# Patient Record
Sex: Female | Born: 1980 | Race: White | Hispanic: No | Marital: Married | Smoking: Former smoker
Health system: Southern US, Community
[De-identification: ages and names within clinical notes are randomized; demographics above are authoritative.]

## PROBLEM LIST (undated history)

## (undated) DIAGNOSIS — I10 Essential (primary) hypertension: Secondary | ICD-10-CM

## (undated) HISTORY — DX: Essential (primary) hypertension: I10

---
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
## 2006-12-03 ENCOUNTER — Inpatient Hospital Stay (HOSPITAL_COMMUNITY): Admission: AD | Admit: 2006-12-03 | Discharge: 2006-12-06 | Payer: Self-pay | Admitting: Obstetrics and Gynecology

## 2006-12-03 ENCOUNTER — Encounter (INDEPENDENT_AMBULATORY_CARE_PROVIDER_SITE_OTHER): Payer: Self-pay | Admitting: Obstetrics & Gynecology

## 2010-07-16 NOTE — Op Note (Signed)
NAMEREKISHA, [name]             ACCOUNT NO.:  [id number]   MEDICAL RECORD NO.:  [id number]          PATIENT TYPE:  INP   LOCATION:  9166                          FACILITY:  [facility]   PHYSICIAN:  Gerrit Friends. Aldona Bar, M.D.   DATE OF BIRTH:  May 30, 1980   DATE OF PROCEDURE:  12/03/2006  DATE OF DISCHARGE:                               OPERATIVE REPORT   PREOPERATIVE DIAGNOSES:  1. Term pregnancy.  2. Labor.  3. Preeclampsia.  4. Arrest of second stage of labor, arrest of descent.   POSTOPERATIVE DIAGNOSES:  1. Term pregnancy.  2. Labor.  3. Preeclampsia.  4. Arrest of second stage of labor, arrest of descent.  5. Delivery of a 6 pound 6 ounce female infant, Apgars 9 and 9.   PROCEDURE:  Primary low transverse cesarean section.   SURGEON:  Gerrit Friends. Aldona Bar, M.D.   ANESTHESIA:  Epidural.   HISTORY:  This 30 year old primigravida was admitted on the morning of  October 2 in early labor and noted to be pre-eclamptic and was begun on  magnesium sulfate IV.  An epidural was eventually placed and her labor  progressed, although the station at the time of my initial examination  was about -2.  At that time she was about 4 cm dilated.   The patient progressed and became fully dilated at about 1:45 p.m. and  began pushing.  At that time the vertex was only had about -1 station.  With approximately an hour and 45 minutes of pushing, the vertex really  did not descend beyond 0 station and only to about +1 with pushing, but  it was mostly caput.  A decision was made because of the arrest disorder  of descent to proceed with a primary low transverse cesarean section.   The patient was taken to the operating room, where after the  satisfactory augmentation of her epidural she was prepped and draped in  the usual fashion.  The Foley catheter came with the patient from the  labor and delivery area.   Once the patient was adequately draped and good anesthetic levels were  documented, the procedure  was begun.  A Pfannenstiel incision was made  and with minimal difficulty dissected down sharply to and through the  fascia in a low transverse fashion with hemostasis created at each  layer.  The subfascial space was created inferiorly and superiorly,  muscles separated in the midline and the peritoneum identified and  entered appropriately with care taken to avoid the bladder inferiorly  and the bowel superiorly.  At this time the vesicouterine peritoneum was  identified, incised in a low transverse fashion, pushed off the lower  uterine segment with ease, and then sharp incision into the lower  segment with Metzenbaum scissors was carried out in a low transverse  fashion and extended laterally with the fingers.  The head, as expected  was wedged in the pelvis but with minimal difficulty was elevated and  the baby was delivered.  The baby cried spontaneously at once, was female,  subsequent weight was found to be 6 pounds 6 ounces, Apgars were noted  to be 9 and 9, and the baby was taken to the newborn nursery in good  condition.   Placenta was delivered intact and passed off  and cord bloods will be  collected by the cord blood donation team.  The patient is a cord blood  donor.   At this time the uterus was exteriorized and rendered free of any  remaining products of conception.  Good uterine contractility was  afforded with slowly-given intravenous Pitocin and manual stimulation.  The incision extended inferiorly somewhat but was easy to identify and  the uterine incision was ultimately closed using two layers, the first  being #1 Vicryl in a running locking fashion and the second being #1  Vicryl in a running imbricating fashion.  An additional figure-of-eight  was placed for additional hemostasis with good results.  At this time  the incision was noted to be dry.  Tubes and ovaries normal, uterus well-  contracted and after the abdomen was lavaged of all free blood and clot  in  all counts were noted to be correct and no foreign bodies were noted  to be remaining in the abdominal cavity, the uterus was replaced and  closure of the abdomen was begun in layers.  The abdominal peritoneum  was closed with 0 Vicryl in a running fashion and the muscle secured  with same.  Assured of good subfascial hemostasis, the fascia was then  reapproximated with 0 Vicryl from angle to midline bilaterally.  Subcutaneous tissue was now hemostatic and staples were then used to  close the skin.  A sterile pressure dressing was applied.  At this time  the patient was transported to the recovery room in satisfactory  condition, having tolerated the procedure well.  Estimated blood loss  500 mL.  All counts correct x2.   CONCLUSION:  This patient essentially had an arrest disorder of the  second stage of labor and was delivered by primary low transverse  cesarean section with delivery of 6 pound 6 ounce female infant with  Apgars of 9 and 9.  Complicating her labor was preeclampsia, which was  controlled/treated with magnesium sulfate intravenously per protocol.  At the conclusion of the procedure both mother and baby were doing well  in their respective recovery areas.      Gerrit Friends. Aldona Bar, M.D.  Electronically Signed     RMW/MEDQ  D:  12/03/2006  T:  12/04/2006  Job:  [id number]

## 2010-07-19 NOTE — Discharge Summary (Signed)
NAMECRISTYN, [name]             ACCOUNT NO.:  [id number]   MEDICAL RECORD NO.:  [id number]          PATIENT TYPE:  INP   LOCATION:  9304                          FACILITY:  [facility]   PHYSICIAN:  Malva Limes, M.D.    DATE OF BIRTH:  June 09, 1980   DATE OF ADMISSION:  12/03/2006  DATE OF DISCHARGE:  12/06/2006                               DISCHARGE SUMMARY   FINAL DIAGNOSES:  Intrauterine pregnancy at term active labor,  preeclampsia arrest at the second stage of labor and arrest of descent.   PROCEDURES:  Primary low transverse cesarean section.  Surgeon Dr.  Annamaria Helling.  Complications:  None.   This 30 year old G1, P0 was admitted on the morning of October 2 in  early labor.  The patient was noted to be  preeclamptic at this time and was started on mag sulfate for  prophylaxis.  The patient's antepartum course up to this point had been  complicated by some late prenatal care, but she did not start receiving  until 21 weeks.  The patient did conceive while using Depo-Provera.  The  patient also had a positive group B strep culture obtained at 35 weeks.  Otherwise, uncomplicated prenatal care.  During patient's hospital  course she like I said was admitted in early labor.  She had an epidural  placed for pain control by the time she was examined by Dr. Aldona Bar.  On  the morning of the second she was about 4 cm dilated.  She became  completely dilated about 1:45 and began pushing.  The vertex at this  point was only in a -1 station after about an hour and 45 minutes of  pushing.  The vertex did not descend beyond a 0 station.  At this point  because of arrest of descent in the second stage of labor, a decision  was made to proceed with a cesarean section.  The patient was taken to  the operating room on December 03, 2006 by Dr. Annamaria Helling where a primary  low transverse cesarean section was performed with the delivery of a 6  pound 6 ounce female infant with Apgars of 9 and 9.  The delivery  went  without complications.  The patient's postoperative course was  complicated by her continued preeclampsia.  She was kept on mag sulfate.  She continued to have elevated blood pressures.  By postoperative day  #2 her preeclampsia was resolving.  She had good urine output.  The mag  sulfate was Skinner to be stopped, and she was transferred to the regular  mother baby unit.  The patient was seen by social services before  discharge.  By postoperative day #3 the patient was stable.  Blood  pressures were still slightly elevated.  She was to check these three  times a day and to call if they are greater than 150/95.  She was sent  home by Dr. Malva Limes on a regular diet, told to decrease her  activities, to continue her vitamins.  Was given Percocet 1-2 every 4-6  hours as needed for her pain. Told she could use over-the-counter  ibuprofen up to 600 mg every six hours as needed for pain.  Was to  continue to check her blood pressures three times daily and to call if  greater than 150/95 and was to follow up in our office one week for her  blood pressure check or sooner as needed.  Instructions and precautions  were reviewed with the patient.   LABORATORY DATA:  Labs on discharge:  She had a hemoglobin of 11.9,  white blood cell count 20.8, platelets 193,000.  The patient did not  have elevated liver function tests and normal preeclamptic panel.      [name], P.A.-C.    ______________________________  Malva Limes, M.D.    MB/MEDQ  D:  01/04/2007  T:  01/05/2007  Job:  [id number]

## 2010-11-11 ENCOUNTER — Other Ambulatory Visit: Payer: Self-pay | Admitting: Obstetrics and Gynecology

## 2010-12-12 LAB — URINALYSIS, ROUTINE W REFLEX MICROSCOPIC
Glucose, UA: NEGATIVE
Leukocytes, UA: NEGATIVE
Nitrite: NEGATIVE
Protein, ur: >300 — AB
Urobilinogen, UA: 0.2

## 2010-12-12 LAB — CBC
HCT: 42.6
Hemoglobin: 11.9 — ABNORMAL LOW
MCHC: 34.7
MCHC: 34.8
MCV: 91.3
MCV: 92.3
Platelets: 247
RBC: 3.71 — ABNORMAL LOW
RDW: 12.5

## 2010-12-12 LAB — URINE MICROSCOPIC-ADD ON

## 2010-12-12 LAB — DIFFERENTIAL
Basophils Relative: 0
Eosinophils Absolute: 0
Monocytes Absolute: 1.2 — ABNORMAL HIGH
Monocytes Relative: 6
Neutro Abs: 18.1 — ABNORMAL HIGH

## 2010-12-12 LAB — MAGNESIUM: Magnesium: 6.5

## 2010-12-12 LAB — COMPREHENSIVE METABOLIC PANEL
Albumin: 2.4 — ABNORMAL LOW
Alkaline Phosphatase: 106
BUN: 14
BUN: 15
CO2: 23
CO2: 25
Calcium: 8.9
Creatinine, Ser: 0.74
GFR calc non Af Amer: >60
GFR calc non Af Amer: >60
Glucose, Bld: 106 — ABNORMAL HIGH
Glucose, Bld: 95
Potassium: 4.4
Sodium: 135
Total Bilirubin: 0.5
Total Bilirubin: 0.5
Total Protein: 5.1 — ABNORMAL LOW
Total Protein: 6.3

## 2011-10-20 ENCOUNTER — Emergency Department (HOSPITAL_COMMUNITY)
Admission: EM | Admit: 2011-10-20 | Discharge: 2011-10-20 | Disposition: A | Discharge status: Home / Self Care | Payer: BC Managed Care – PPO | Attending: Emergency Medicine | Admitting: Emergency Medicine

## 2011-10-20 ENCOUNTER — Encounter (HOSPITAL_COMMUNITY): Payer: Self-pay | Admitting: *Deleted

## 2011-10-20 ENCOUNTER — Emergency Department (HOSPITAL_COMMUNITY): Payer: BC Managed Care – PPO

## 2011-10-20 DIAGNOSIS — R112 Nausea with vomiting, unspecified: Secondary | ICD-10-CM | POA: Insufficient documentation

## 2011-10-20 DIAGNOSIS — R109 Unspecified abdominal pain: Secondary | ICD-10-CM

## 2011-10-20 DIAGNOSIS — M545 Low back pain, unspecified: Secondary | ICD-10-CM | POA: Insufficient documentation

## 2011-10-20 LAB — CBC WITH DIFFERENTIAL/PLATELET
Basophils Absolute: 0 K/uL (ref 0.0–0.1)
Basophils Relative: 0 % (ref 0–1)
Eosinophils Absolute: 0.1 10*3/uL (ref 0.0–0.7)
Eosinophils Relative: 0 % (ref 0–5)
HCT: 42.8 % (ref 36.0–46.0)
Hemoglobin: 14.8 g/dL (ref 12.0–15.0)
Lymphocytes Relative: 17 % (ref 12–46)
Lymphs Abs: 2 K/uL (ref 0.7–4.0)
MCH: 30.6 pg (ref 26.0–34.0)
MCHC: 34.6 g/dL (ref 30.0–36.0)
MCV: 88.6 fL (ref 78.0–100.0)
Monocytes Absolute: 0.6 K/uL (ref 0.1–1.0)
Monocytes Relative: 5 % (ref 3–12)
Neutro Abs: 9.1 K/uL — ABNORMAL HIGH (ref 1.7–7.7)
Neutrophils Relative %: 77 % (ref 43–77)
Platelets: 226 10*3/uL (ref 150–400)
RBC: 4.83 MIL/uL (ref 3.87–5.11)
RDW: 11.9 % (ref 11.5–15.5)
WBC: 11.8 K/uL — ABNORMAL HIGH (ref 4.0–10.5)

## 2011-10-20 LAB — URINALYSIS, ROUTINE W REFLEX MICROSCOPIC
Bilirubin Urine: NEGATIVE
Glucose, UA: NEGATIVE mg/dL
Hgb urine dipstick: NEGATIVE
Ketones, ur: NEGATIVE mg/dL
Leukocytes, UA: NEGATIVE
Nitrite: NEGATIVE
Protein, ur: NEGATIVE mg/dL
Specific Gravity, Urine: 1.012 (ref 1.005–1.030)
Urobilinogen, UA: 0.2 mg/dL (ref 0.0–1.0)
pH: 7.5 (ref 5.0–8.0)

## 2011-10-20 LAB — POCT I-STAT, CHEM 8
BUN: 12 mg/dL (ref 6–23)
Calcium, Ion: 1.12 mmol/L (ref 1.12–1.23)
Chloride: 105 meq/L (ref 96–112)
Creatinine, Ser: 0.8 mg/dL (ref 0.50–1.10)
Glucose, Bld: 133 mg/dL — ABNORMAL HIGH (ref 70–99)
HCT: 46 % (ref 36.0–46.0)
Hemoglobin: 15.6 g/dL — ABNORMAL HIGH (ref 12.0–15.0)
Potassium: 3.2 mEq/L — ABNORMAL LOW (ref 3.5–5.1)
Sodium: 140 mEq/L (ref 135–145)
TCO2: 21 mmol/L (ref 0–100)

## 2011-10-20 MED ORDER — ONDANSETRON HCL 4 MG/2ML IJ SOLN
INTRAMUSCULAR | Status: AC
  Administered 2011-10-20: 4 mg via INTRAVENOUS
  Filled 2011-10-20: qty 2

## 2011-10-20 MED ORDER — MORPHINE SULFATE 4 MG/ML IJ SOLN
INTRAMUSCULAR | Status: AC
  Administered 2011-10-20: 4 mg via INTRAVENOUS
  Filled 2011-10-20: qty 1

## 2011-10-20 MED ORDER — KETOROLAC TROMETHAMINE 30 MG/ML IJ SOLN
30.0000 mg | Freq: Once | INTRAMUSCULAR | Status: AC
  Administered 2011-10-20: 30 mg via INTRAVENOUS
  Filled 2011-10-20: qty 1

## 2011-10-20 MED ORDER — HYDROMORPHONE HCL PF 1 MG/ML IJ SOLN
1.0000 mg | Freq: Once | INTRAMUSCULAR | Status: AC
  Administered 2011-10-20: 1 mg via INTRAVENOUS
  Filled 2011-10-20: qty 1

## 2011-10-20 MED ORDER — ONDANSETRON 8 MG PO TBDP: 8.0000 mg | ORAL_TABLET | Freq: Two times a day (BID) | ORAL | Status: AC | PRN

## 2011-10-20 MED ORDER — ACETAMINOPHEN-CODEINE #3 300-30 MG PO TABS: 1.0000 | ORAL_TABLET | Freq: Four times a day (QID) | ORAL | Status: AC | PRN

## 2011-10-20 NOTE — ED Notes (Signed)
Pt is here with L lower back that started over one hour ago and reports nausea and vomiting.  LMP- one month ago

## 2011-10-20 NOTE — ED Provider Notes (Signed)
History     CSN: [id number]  Arrival date & time 10/20/11  1420   First MD Initiated Contact with Patient 10/20/11 1517      Chief Complaint  Patient presents with  . Back Pain    (Consider location/radiation/quality/duration/timing/severity/associated sxs/prior treatment) HPI Comments: Level 5 caveat due to 2 severe pain and urgent need for intervention. Patient reports sudden onset of left mid and low back pain that radiates to the left abdomen about 2 hours ago. She reports that she has the urge to either urinate or defecate she is unsure. The pain is quite severe. She did have some nausea and vomiting as well. She reports she was previously in her usual state of health. She has not taken any medications for this. Prior to my evaluation, she did receive morphine and Zofran while in triage due to her severe symptoms and she reports no significant improvement at this time. She denies chest pain or shortness of breath. She reports her primary care physician is with [facility]. Prior records show that she did have cesarean section 5 years ago do to preeclampsia.  Patient is a 31 y.o. female presenting with back pain. The history is provided by the patient and medical records.  Back Pain     History reviewed. No pertinent past medical history.  Past Surgical History  Procedure Date  . Cesarean section     History reviewed. No pertinent family history.  History  Substance Use Topics  . Smoking status: Not on file  . Smokeless tobacco: Not on file  . Alcohol Use: Yes     occ    OB History    Grav Para Term Preterm Abortions TAB SAB Ect Mult Living                  Review of Systems  Unable to perform ROS: Other  Musculoskeletal: Positive for back pain.    Allergies  Review of patient's allergies indicates no known allergies.  Home Medications   Current Outpatient Rx  Name Route Sig Dispense Refill  . ACETAMINOPHEN-CODEINE #3 300-30 MG PO TABS Oral Take 1-2  tablets by mouth every 6 (six) hours as needed for pain. 15 tablet 0  . ONDANSETRON 8 MG PO TBDP Oral Take 1 tablet (8 mg total) by mouth every 12 (twelve) hours as needed for nausea. 20 tablet 0    BP 134/84  Pulse 66  Resp 17  SpO2 100%  LMP 09/26/2011  Physical Exam  Nursing note and vitals reviewed. Constitutional: She is oriented to person, place, and time. She appears well-developed and well-nourished.  HENT:  Head: Normocephalic and atraumatic.  Eyes: Pupils are equal, round, and reactive to light. No scleral icterus.  Neck: Normal range of motion. Neck supple.  Cardiovascular: Normal rate and regular rhythm.   Pulmonary/Chest: Effort normal.  Abdominal: Soft. Normal appearance. She exhibits no distension. There is tenderness in the left upper quadrant and left lower quadrant. There is guarding and CVA tenderness. There is no rebound.  Neurological: She is alert and oriented to person, place, and time.  Skin: Skin is warm and dry. No rash noted.    ED Course  Procedures (including critical care time)  Labs Reviewed  URINALYSIS, ROUTINE W REFLEX MICROSCOPIC - Abnormal; Notable for the following:    APPearance HAZY (*)     All other components within normal limits  CBC WITH DIFFERENTIAL - Abnormal; Notable for the following:    WBC 11.8 (*)  Neutro Abs 9.1 (*)     All other components within normal limits  POCT I-STAT, CHEM 8 - Abnormal; Notable for the following:    Potassium 3.2 (*)     Glucose, Bld 133 (*)     Hemoglobin 15.6 (*)     All other components within normal limits  POCT PREGNANCY, URINE   Ct Abdomen Pelvis Wo Contrast  10/20/2011  *RADIOLOGY REPORT*  Clinical Data: Left lower back pain.  Pelvic pressure.  Nausea and vomiting.  Negative pregnancy test  CT ABDOMEN AND PELVIS WITHOUT CONTRAST  Technique:  Multidetector CT imaging of the abdomen and pelvis was performed following the standard protocol without intravenous contrast.  Comparison: None.   Findings: Lung bases are clear.  Unenhanced images of the liver and spleen are normal.  Gallbladder bile ducts are normal.  Pancreas is normal.  Negative for renal calculi.  No hydronephrosis or renal mass.  Negative for bowel obstruction or bowel thickening.  Appendix is normal.  No free fluid.  Negative for mass or adenopathy. Lumbar spine appears normal.  IMPRESSION: Negative   Original Report Authenticated By: Camelia Phenes, M.D.      1. Flank pain     RA sat is 100% and I interpret to be normal.  5:50 PM Pt feeling much improved after meds.  7:30 PM  CT shows no acute abn's.  Discussed at length.  Since symptoms are completely resolved, soft abd now, no back or flank pain, I have low suspicion for pelvic pathology, pt instructed to return immediately if symptoms recur.  At this point, any dilaudid and morphine in her system is gone and symptom free still.    MDM  Pt appears to be having ureteral colic.  No h/o stones.  No hematuria noted, but clinical symptoms present.  If neg, will need pelvic exam, although clinically her symptoms are upper abd and left upper flank.          Gavin Pound. Oletta Lamas, MD 10/20/11 2010

## 2011-10-20 NOTE — ED Notes (Signed)
Pt brought back to room from triage; pt undressed, in gown, on continuous pulse oximetry and blood pressure cuff; pt states she "needs to pee", pt ambulated well without difficulty or any distress

## 2013-06-08 ENCOUNTER — Other Ambulatory Visit: Payer: Self-pay | Admitting: Obstetrics & Gynecology

## 2014-06-12 ENCOUNTER — Other Ambulatory Visit: Payer: Self-pay | Admitting: Obstetrics & Gynecology

## 2014-06-13 ENCOUNTER — Other Ambulatory Visit: Payer: Self-pay | Admitting: Obstetrics & Gynecology

## 2014-06-13 DIAGNOSIS — N6321 Unspecified lump in the left breast, upper outer quadrant: Secondary | ICD-10-CM

## 2014-06-13 LAB — CYTOLOGY - PAP

## 2014-06-21 ENCOUNTER — Other Ambulatory Visit: Payer: Self-pay

## 2014-06-28 ENCOUNTER — Other Ambulatory Visit: Payer: Self-pay

## 2015-09-20 ENCOUNTER — Other Ambulatory Visit: Payer: Self-pay | Admitting: Family Medicine

## 2015-09-20 DIAGNOSIS — N6320 Unspecified lump in the left breast, unspecified quadrant: Secondary | ICD-10-CM

## 2015-09-26 ENCOUNTER — Ambulatory Visit
Admission: RE | Admit: 2015-09-26 | Discharge: 2015-09-26 | Disposition: A | Discharge status: Home / Self Care | Payer: BLUE CROSS/BLUE SHIELD | Source: Ambulatory Visit | Attending: Family Medicine | Admitting: Family Medicine

## 2015-09-26 DIAGNOSIS — N6320 Unspecified lump in the left breast, unspecified quadrant: Secondary | ICD-10-CM

## 2015-10-09 ENCOUNTER — Ambulatory Visit: Payer: Self-pay | Admitting: Primary Care

## 2015-10-09 DIAGNOSIS — Z0289 Encounter for other administrative examinations: Secondary | ICD-10-CM

## 2016-03-03 NOTE — L&D Delivery Note (Signed)
Patient is 36 y.o. [id number] admitted for preterm labor.   Delivery Note A viable female was delivered via  VBAC, Presentation: cephalic,LOA. APGAR: 8,8 ; weight pending.   Placenta status: spontaneous, intact. Cord: 3 vessel  Anesthesia:  epidural Episiotomy:  none Lacerations:  2nd degree Suture Repair: 3-0 vicryl Est. Blood Loss (mL): 350  Mom to postpartum.  Baby to Couplet care / Skin to Skin.  Rolm BookbinderAmber Helmer Dull, DO [facility]

## 2016-08-12 ENCOUNTER — Other Ambulatory Visit: Payer: Self-pay | Admitting: Obstetrics & Gynecology

## 2016-08-12 DIAGNOSIS — O3680X0 Pregnancy with inconclusive fetal viability, not applicable or unspecified: Secondary | ICD-10-CM

## 2016-08-13 ENCOUNTER — Ambulatory Visit: Payer: BLUE CROSS/BLUE SHIELD

## 2016-08-19 ENCOUNTER — Encounter: Payer: Self-pay | Admitting: *Deleted

## 2016-08-25 ENCOUNTER — Ambulatory Visit: Payer: BLUE CROSS/BLUE SHIELD | Admitting: *Deleted

## 2016-08-25 ENCOUNTER — Ambulatory Visit (INDEPENDENT_AMBULATORY_CARE_PROVIDER_SITE_OTHER): Payer: BLUE CROSS/BLUE SHIELD | Admitting: Women's Health

## 2016-08-25 ENCOUNTER — Encounter: Payer: Self-pay | Admitting: Women's Health

## 2016-08-25 VITALS — BP 122/72 | HR 83 | Ht 65.0 in | Wt 167.0 lb

## 2016-08-25 DIAGNOSIS — Z3490 Encounter for supervision of normal pregnancy, unspecified, unspecified trimester: Secondary | ICD-10-CM | POA: Insufficient documentation

## 2016-08-25 DIAGNOSIS — Z3A11 11 weeks gestation of pregnancy: Secondary | ICD-10-CM | POA: Diagnosis not present

## 2016-08-25 DIAGNOSIS — O09291 Supervision of pregnancy with other poor reproductive or obstetric history, first trimester: Secondary | ICD-10-CM

## 2016-08-25 DIAGNOSIS — Z331 Pregnant state, incidental: Secondary | ICD-10-CM | POA: Diagnosis not present

## 2016-08-25 DIAGNOSIS — Z1389 Encounter for screening for other disorder: Secondary | ICD-10-CM

## 2016-08-25 DIAGNOSIS — Z3481 Encounter for supervision of other normal pregnancy, first trimester: Secondary | ICD-10-CM

## 2016-08-25 DIAGNOSIS — O09299 Supervision of pregnancy with other poor reproductive or obstetric history, unspecified trimester: Secondary | ICD-10-CM | POA: Insufficient documentation

## 2016-08-25 DIAGNOSIS — O34219 Maternal care for unspecified type scar from previous cesarean delivery: Secondary | ICD-10-CM | POA: Insufficient documentation

## 2016-08-25 LAB — POCT URINALYSIS DIPSTICK
Glucose, UA: NEGATIVE
Ketones, UA: NEGATIVE
Leukocytes, UA: NEGATIVE
NITRITE UA: NEGATIVE
PROTEIN UA: NEGATIVE
RBC UA: NEGATIVE

## 2016-08-25 NOTE — Patient Instructions (Addendum)
Begin taking a 81mg  baby aspirin daily at 12 weeks of pregnancy (6/28) to decrease risk of preeclampsia during pregnancy   Nausea & Vomiting  Have saltine crackers or pretzels by your bed and eat a few bites before you raise your head out of bed in the morning  Eat small frequent meals throughout the day instead of large meals  Drink plenty of fluids throughout the day to stay hydrated, just don't drink a lot of fluids with your meals.  This can make your stomach fill up faster making you feel sick  Do not brush your teeth right after you eat  Products with real ginger are good for nausea, like ginger ale and ginger hard candy Make sure it says made with real ginger!  Sucking on sour candy like lemon heads is also good for nausea  If your prenatal vitamins make you nauseated, take them at night so you will sleep through the nausea  Sea Bands  If you feel like you need medicine for the nausea & vomiting please let us know  If you are unable to keep any fluids or food down please let us know   Constipation  Drink plenty of fluid, preferably water, throughout the day  Eat foods high in fiber such as fruits, vegetables, and grains  Exercise, such as walking, is a good way to keep your bowels regular  Drink warm fluids, especially warm prune juice, or decaf coffee  Eat a 1/2 cup of real oatmeal (not instant), 1/2 cup applesauce, and 1/2-1 cup warm prune juice every day  If needed, you may take Colace (docusate sodium) stool softener once or twice a day to help keep the stool soft. If you are pregnant, wait until you are out of your first trimester (12-14 weeks of pregnancy)  If you still are having problems with constipation, you may take Miralax once daily as needed to help keep your bowels regular.  If you are pregnant, wait until you are out of your first trimester (12-14 weeks of pregnancy)   First Trimester of Pregnancy The first trimester of pregnancy is from week 1 until  the end of week 12 (months 1 through 3). A week after a sperm fertilizes an egg, the egg will implant on the wall of the uterus. This embryo will begin to develop into a baby. Genes from you and your partner are forming the baby. The female genes determine whether the baby is a boy or a girl. At 6-8 weeks, the eyes and face are formed, and the heartbeat can be seen on ultrasound. At the end of 12 weeks, all the baby's organs are formed.  Now that you are pregnant, you will want to do everything you can to have a healthy baby. Two of the most important things are to get good prenatal care and to follow your health care provider's instructions. Prenatal care is all the medical care you receive before the baby's birth. This care will help prevent, find, and treat any problems during the pregnancy and childbirth. BODY CHANGES Your body goes through many changes during pregnancy. The changes vary from woman to woman.   You may gain or lose a couple of pounds at first.  You may feel sick to your stomach (nauseous) and throw up (vomit). If the vomiting is uncontrollable, call your health care provider.  You may tire easily.  You may develop headaches that can be relieved by medicines approved by your health care provider.  You may urinate  more often. Painful urination may mean you have a bladder infection.  You may develop heartburn as a result of your pregnancy.  You may develop constipation because certain hormones are causing the muscles that push waste through your intestines to slow down.  You may develop hemorrhoids or swollen, bulging veins (varicose veins).  Your breasts may begin to grow larger and become tender. Your nipples may stick out more, and the tissue that surrounds them (areola) may become darker.  Your gums may bleed and may be sensitive to brushing and flossing.  Dark spots or blotches (chloasma, mask of pregnancy) may develop on your face. This will likely fade after the baby is  born.  Your menstrual periods will stop.  You may have a loss of appetite.  You may develop cravings for certain kinds of food.  You may have changes in your emotions from day to day, such as being excited to be pregnant or being concerned that something may go wrong with the pregnancy and baby.  You may have more vivid and strange dreams.  You may have changes in your hair. These can include thickening of your hair, rapid growth, and changes in texture. Some women also have hair loss during or after pregnancy, or hair that feels dry or thin. Your hair will most likely return to normal after your baby is born. WHAT TO EXPECT AT YOUR PRENATAL VISITS During a routine prenatal visit:  You will be weighed to make sure you and the baby are growing normally.  Your blood pressure will be taken.  Your abdomen will be measured to track your baby's growth.  The fetal heartbeat will be listened to starting around week 10 or 12 of your pregnancy.  Test results from any previous visits will be discussed. Your health care provider may ask you:  How you are feeling.  If you are feeling the baby move.  If you have had any abnormal symptoms, such as leaking fluid, bleeding, severe headaches, or abdominal cramping.  If you have any questions. Other tests that may be performed during your first trimester include:  Blood tests to find your blood type and to check for the presence of any previous infections. They will also be used to check for low iron levels (anemia) and Rh antibodies. Later in the pregnancy, blood tests for diabetes will be done along with other tests if problems develop.  Urine tests to check for infections, diabetes, or protein in the urine.  An ultrasound to confirm the proper growth and development of the baby.  An amniocentesis to check for possible genetic problems.  Fetal screens for spina bifida and Down syndrome.  You may need other tests to make sure you and the  baby are doing well. HOME CARE INSTRUCTIONS  Medicines  Follow your health care provider's instructions regarding medicine use. Specific medicines may be either safe or unsafe to take during pregnancy.  Take your prenatal vitamins as directed.  If you develop constipation, try taking a stool softener if your health care provider approves. Diet  Eat regular, well-balanced meals. Choose a variety of foods, such as meat or vegetable-based protein, fish, milk and low-fat dairy products, vegetables, fruits, and whole grain breads and cereals. Your health care provider will help you determine the amount of weight gain that is right for you.  Avoid raw meat and uncooked cheese. These carry germs that can cause birth defects in the baby.  Eating four or five small meals rather than three   large meals a day may help relieve nausea and vomiting. If you start to feel nauseous, eating a few soda crackers can be helpful. Drinking liquids between meals instead of during meals also seems to help nausea and vomiting.  If you develop constipation, eat more high-fiber foods, such as fresh vegetables or fruit and whole grains. Drink enough fluids to keep your urine clear or pale yellow. Activity and Exercise  Exercise only as directed by your health care provider. Exercising will help you:  Control your weight.  Stay in shape.  Be prepared for labor and delivery.  Experiencing pain or cramping in the lower abdomen or low back is a good sign that you should stop exercising. Check with your health care provider before continuing normal exercises.  Try to avoid standing for long periods of time. Move your legs often if you must stand in one place for a long time.  Avoid heavy lifting.  Wear low-heeled shoes, and practice good posture.  You may continue to have sex unless your health care provider directs you otherwise. Relief of Pain or Discomfort  Wear a good support bra for breast tenderness.    Take warm sitz baths to soothe any pain or discomfort caused by hemorrhoids. Use hemorrhoid cream if your health care provider approves.   Rest with your legs elevated if you have leg cramps or low back pain.  If you develop varicose veins in your legs, wear support hose. Elevate your feet for 15 minutes, 3-4 times a day. Limit salt in your diet. Prenatal Care  Schedule your prenatal visits by the twelfth week of pregnancy. They are usually scheduled monthly at first, then more often in the last 2 months before delivery.  Write down your questions. Take them to your prenatal visits.  Keep all your prenatal visits as directed by your health care provider. Safety  Wear your seat belt at all times when driving.  Make a list of emergency phone numbers, including numbers for family, friends, the hospital, and police and fire departments. General Tips  Ask your health care provider for a referral to a local prenatal education class. Begin classes no later than at the beginning of month 6 of your pregnancy.  Ask for help if you have counseling or nutritional needs during pregnancy. Your health care provider can offer advice or refer you to specialists for help with various needs.  Do not use hot tubs, steam rooms, or saunas.  Do not douche or use tampons or scented sanitary pads.  Do not cross your legs for long periods of time.  Avoid cat litter boxes and soil used by cats. These carry germs that can cause birth defects in the baby and possibly loss of the fetus by miscarriage or stillbirth.  Avoid all smoking, herbs, alcohol, and medicines not prescribed by your health care provider. Chemicals in these affect the formation and growth of the baby.  Schedule a dentist appointment. At home, brush your teeth with a soft toothbrush and be gentle when you floss. SEEK MEDICAL CARE IF:   You have dizziness.  You have mild pelvic cramps, pelvic pressure, or nagging pain in the abdominal  area.  You have persistent nausea, vomiting, or diarrhea.  You have a bad smelling vaginal discharge.  You have pain with urination.  You notice increased swelling in your face, hands, legs, or ankles. SEEK IMMEDIATE MEDICAL CARE IF:   You have a fever.  You are leaking fluid from your vagina.  You   have spotting or bleeding from your vagina.  You have severe abdominal cramping or pain.  You have rapid weight gain or loss.  You vomit blood or material that looks like coffee grounds.  You are exposed to German measles and have never had them.  You are exposed to fifth disease or chickenpox.  You develop a severe headache.  You have shortness of breath.  You have any kind of trauma, such as from a fall or a car accident. Document Released: 02/11/2001 Document Revised: 07/04/2013 Document Reviewed: 12/28/2012 ExitCare Patient Information 2015 [employer]. This information is not intended to replace advice given to you by your health care provider. Make sure you discuss any questions you have with your health care provider.   

## 2016-08-25 NOTE — Progress Notes (Signed)
  Subjective:  [name] is a 36 y.o. 102P1001 Caucasian female at 2276w4d by outside [facility]/ CRL c/w 2029w0d, being seen today for her first obstetrical visit.  Her obstetrical history is significant for 38wk c/s d/t failure to descend after ~ 1.5hrs, baby weighed 6lb6oz, intrapartum pre-e. [facility]. Pregnancy history fully reviewed.  Patient reports no complaints. Denies vb, cramping, uti s/s, abnormal/malodorous vag d/c, or vulvovaginal itching/irritation.  BP 122/72   Pulse 83   Ht 5\' 5"  (1.651 m)   Wt 167 lb (75.8 kg)   LMP 05/26/2016   BMI 27.79 kg/m   HISTORY: OB History  Gravida Para Term Preterm AB Living  2 1 1     1   SAB TAB Ectopic Multiple Live Births          1    # Outcome Date GA Lbr Len/2nd Weight Sex Delivery Anes PTL Lv  2 Current           1 Term 12/03/06 439w0d  6 lb 6 oz (2.892 kg) M CS-LTranv EPI  LIV     Complications: Failure to Progress in Second Stage     Birth Comments: c/s for failure to descend, pushed x 1.5hrs, pre-e     Past Medical History:  Diagnosis Date  . Pregnancy induced hypertension    Past Surgical History:  Procedure Laterality Date  . CESAREAN SECTION     Family History  Problem Relation Age of Onset  . Cancer Mother        breast  . Diabetes Father   . Cancer Maternal Grandmother     Exam   System:     General: Well developed & nourished, no acute distress   Skin: Warm & dry, normal coloration and turgor, no rashes   Neurologic: Alert & oriented, normal mood   Cardiovascular: Regular rate & rhythm   Respiratory: Effort & rate normal, LCTAB, acyanotic   Abdomen: Soft, non tender   Extremities: normal strength, tone  Thin prep pap smear neg 2016  FHR: + via informal transabdominal u/s, w/ active fetus   Assessment:   Pregnancy: G2P1001 Patient Active Problem List   Diagnosis Date Noted  . Supervision of normal pregnancy 08/25/2016     7776w4d G2P1001 New OB visit Prev c/s for failure to descend H/O pre-e   Plan:  Initial labs obtained, including baseline cmp and p:c ratio Continue prenatal vitamins Problem list reviewed and updated Reviewed n/v relief measures and warning s/s to report Reviewed recommended weight gain based on pre-gravid BMI Encouraged well-balanced diet Genetic Screening discussed Integrated Screen: declined Cystic fibrosis screening discussed declined Ultrasound discussed; fetal survey: requested Follow up in 4 weeks for visit [facility] completed Discussed [facility], consent given to take home and review Begin taking a 81mg  baby aspirin daily at 12 weeks of pregnancy to decrease risk of preeclampsia during pregnancy   Marge DuncansBooker, Dorothey Oetken Randall CNM, [facility]NP-BC 08/25/2016 4:31 PM

## 2016-08-26 ENCOUNTER — Telehealth: Payer: Self-pay | Admitting: *Deleted

## 2016-08-26 ENCOUNTER — Encounter: Payer: Self-pay | Admitting: Women's Health

## 2016-08-26 DIAGNOSIS — F1290 Cannabis use, unspecified, uncomplicated: Secondary | ICD-10-CM | POA: Insufficient documentation

## 2016-08-26 DIAGNOSIS — R7989 Other specified abnormal findings of blood chemistry: Secondary | ICD-10-CM | POA: Insufficient documentation

## 2016-08-26 LAB — PMP SCREEN PROFILE (10S), URINE
AMPHETAMINE SCREEN URINE: NEGATIVE ng/mL
BARBITURATE SCREEN URINE: NEGATIVE ng/mL
BENZODIAZEPINE SCREEN, URINE: NEGATIVE ng/mL
CANNABINOIDS UR QL SCN: POSITIVE ng/mL
COCAINE(METAB.)SCREEN, URINE: NEGATIVE ng/mL
Creatinine(Crt), U: 161.6 mg/dL (ref 20.0–300.0)
Methadone Screen, Urine: NEGATIVE ng/mL
OPIATE SCREEN URINE: NEGATIVE ng/mL
OXYCODONE+OXYMORPHONE UR QL SCN: NEGATIVE ng/mL
PHENCYCLIDINE QUANTITATIVE URINE: NEGATIVE ng/mL
Ph of Urine: 5.8 (ref 4.5–8.9)
Propoxyphene Scrn, Ur: NEGATIVE ng/mL

## 2016-08-26 LAB — COMPREHENSIVE METABOLIC PANEL
A/G RATIO: 1.6 (ref 1.2–2.2)
ALK PHOS: 95 IU/L (ref 39–117)
ALT: 23 IU/L (ref 0–32)
AST: 19 IU/L (ref 0–40)
Albumin: 3.8 g/dL (ref 3.5–5.5)
BUN/Creatinine Ratio: 10 (ref 9–23)
BUN: 12 mg/dL (ref 6–20)
CO2: 22 mmol/L (ref 20–29)
Calcium: 8.6 mg/dL — ABNORMAL LOW (ref 8.7–10.2)
Chloride: 100 mmol/L (ref 96–106)
Creatinine, Ser: 1.25 mg/dL — ABNORMAL HIGH (ref 0.57–1.00)
GFR calc Af Amer: 64 mL/min/{1.73_m2} (ref >59)
GFR, EST NON AFRICAN AMERICAN: 56 mL/min/{1.73_m2} — AB (ref >59)
GLOBULIN, TOTAL: 2.4 g/dL (ref 1.5–4.5)
Glucose: 87 mg/dL (ref 65–99)
POTASSIUM: 4.4 mmol/L (ref 3.5–5.2)
SODIUM: 137 mmol/L (ref 134–144)
Total Protein: 6.2 g/dL (ref 6.0–8.5)

## 2016-08-26 LAB — URINALYSIS, ROUTINE W REFLEX MICROSCOPIC
BILIRUBIN UA: NEGATIVE
GLUCOSE, UA: NEGATIVE
KETONES UA: NEGATIVE
Leukocytes, UA: NEGATIVE
Nitrite, UA: NEGATIVE
PH UA: 6.5 (ref 5.0–7.5)
Protein, UA: NEGATIVE
RBC, UA: NEGATIVE
Specific Gravity, UA: 1.016 (ref 1.005–1.030)
Urobilinogen, Ur: 0.2 mg/dL (ref 0.2–1.0)

## 2016-08-26 LAB — CBC
HEMATOCRIT: 38.6 % (ref 34.0–46.6)
HEMOGLOBIN: 12.3 g/dL (ref 11.1–15.9)
MCH: 29.8 pg (ref 26.6–33.0)
MCHC: 31.9 g/dL (ref 31.5–35.7)
MCV: 94 fL (ref 79–97)
Platelets: 272 10*3/uL (ref 150–379)
RBC: 4.13 x10E6/uL (ref 3.77–5.28)
RDW: 13.1 % (ref 12.3–15.4)
WBC: 9.9 10*3/uL (ref 3.4–10.8)

## 2016-08-26 LAB — VARICELLA ZOSTER ANTIBODY, IGG: VARICELLA: 1087 {index} (ref >165)

## 2016-08-26 LAB — ABO/RH: Rh Factor: POSITIVE

## 2016-08-26 LAB — PROTEIN / CREATININE RATIO, URINE
CREATININE, UR: 153.3 mg/dL
PROTEIN UR: 8.3 mg/dL
Protein/Creat Ratio: 54 mg/g creat (ref 0–200)

## 2016-08-26 LAB — HEPATITIS B SURFACE ANTIGEN: Hepatitis B Surface Ag: NEGATIVE

## 2016-08-26 LAB — RUBELLA SCREEN: Rubella Antibodies, IGG: 1.25 index (ref >0.99)

## 2016-08-26 LAB — HIV ANTIBODY (ROUTINE TESTING W REFLEX): HIV Screen 4th Generation wRfx: NONREACTIVE

## 2016-08-26 LAB — ANTIBODY SCREEN: Antibody Screen: NEGATIVE

## 2016-08-26 LAB — RPR: RPR: NONREACTIVE

## 2016-08-26 NOTE — Telephone Encounter (Signed)
Informed patient of slightly elevated creatinine and need for repeat at next visit. Advised to drink plenty of water so urine is light yellow to clear in color especially day before her next visit. We will repeat lab at that appointment. Verbalized understanding.

## 2016-08-27 LAB — URINE CULTURE

## 2016-08-27 LAB — GC/CHLAMYDIA PROBE AMP
Chlamydia trachomatis, NAA: NEGATIVE
NEISSERIA GONORRHOEAE BY PCR: NEGATIVE

## 2016-09-24 ENCOUNTER — Encounter: Payer: Self-pay | Admitting: Women's Health

## 2016-09-24 ENCOUNTER — Ambulatory Visit (INDEPENDENT_AMBULATORY_CARE_PROVIDER_SITE_OTHER): Payer: BLUE CROSS/BLUE SHIELD | Admitting: Women's Health

## 2016-09-24 VITALS — BP 100/70 | HR 80 | Wt 165.0 lb

## 2016-09-24 DIAGNOSIS — R7989 Other specified abnormal findings of blood chemistry: Secondary | ICD-10-CM

## 2016-09-24 DIAGNOSIS — Z3A15 15 weeks gestation of pregnancy: Secondary | ICD-10-CM

## 2016-09-24 DIAGNOSIS — O09892 Supervision of other high risk pregnancies, second trimester: Secondary | ICD-10-CM

## 2016-09-24 DIAGNOSIS — Z3482 Encounter for supervision of other normal pregnancy, second trimester: Secondary | ICD-10-CM

## 2016-09-24 DIAGNOSIS — Z331 Pregnant state, incidental: Secondary | ICD-10-CM | POA: Diagnosis not present

## 2016-09-24 DIAGNOSIS — O288 Other abnormal findings on antenatal screening of mother: Secondary | ICD-10-CM

## 2016-09-24 DIAGNOSIS — Z363 Encounter for antenatal screening for malformations: Secondary | ICD-10-CM

## 2016-09-24 DIAGNOSIS — Z1389 Encounter for screening for other disorder: Secondary | ICD-10-CM

## 2016-09-24 LAB — POCT URINALYSIS DIPSTICK
Blood, UA: NEGATIVE
GLUCOSE UA: NEGATIVE
Leukocytes, UA: NEGATIVE
Nitrite, UA: NEGATIVE

## 2016-09-24 NOTE — Patient Instructions (Addendum)
Tips to Help Leg Cramps  Increase dietary sources of calcium (milk, yogurt, cheese, leafy greens, seafood, legumes, and fruit) and magnesium (dark leafy greens, nuts, seeds, fish, beans, whole grains, avocados, yogurt, bananas, dried fruit, dark chocolate)  Spoonful of regular yellow mustard every night  Pickle juice  Magnesium supplement: in the morning, at night (can find in the vitamin aisle)  Dorsiflexion of foot: pointing your toes back towards your knee during the cramp      Second Trimester of Pregnancy The second trimester is from week 14 through week 27 (months 4 through 6). The second trimester is often a time when you feel your best. Your body has adjusted to being pregnant, and you begin to feel better physically. Usually, morning sickness has lessened or quit completely, you may have more energy, and you may have an increase in appetite. The second trimester is also a time when the fetus is growing rapidly. At the end of the sixth month, the fetus is about 9 inches long and weighs about 1 pounds. You will likely begin to feel the baby move (quickening) between 16 and 20 weeks of pregnancy. Body changes during your second trimester Your body continues to go through many changes during your second trimester. The changes vary from woman to woman.  Your weight will continue to increase. You will notice your lower abdomen bulging out.  You may begin to get stretch marks on your hips, abdomen, and breasts.  You may develop headaches that can be relieved by medicines. The medicines should be approved by your health care provider.  You may urinate more often because the fetus is pressing on your bladder.  You may develop or continue to have heartburn as a result of your pregnancy.  You may develop constipation because certain hormones are causing the muscles that push waste through your intestines to slow down.  You may develop hemorrhoids or swollen, bulging veins  (varicose veins).  You may have back pain. This is caused by: ? Weight gain. ? Pregnancy hormones that are relaxing the joints in your pelvis. ? A shift in weight and the muscles that support your balance.  Your breasts will continue to grow and they will continue to become tender.  Your gums may bleed and may be sensitive to brushing and flossing.  Dark spots or blotches (chloasma, mask of pregnancy) may develop on your face. This will likely fade after the baby is born.  A dark line from your belly button to the pubic area (linea nigra) may appear. This will likely fade after the baby is born.  You may have changes in your hair. These can include thickening of your hair, rapid growth, and changes in texture. Some women also have hair loss during or after pregnancy, or hair that feels dry or thin. Your hair will most likely return to normal after your baby is born.  What to expect at prenatal visits During a routine prenatal visit:  You will be weighed to make sure you and the fetus are growing normally.  Your blood pressure will be taken.  Your abdomen will be measured to track your baby's growth.  The fetal heartbeat will be listened to.  Any test results from the previous visit will be discussed.  Your health care provider may ask you:  How you are feeling.  If you are feeling the baby move.  If you have had any abnormal symptoms, such as leaking fluid, bleeding, severe headaches, or abdominal cramping.  If you are using any tobacco products, including cigarettes, chewing tobacco, and electronic cigarettes.  If you have any questions.  Other tests that may be performed during your second trimester include:  Blood tests that check for: ? Low iron levels (anemia). ? High blood sugar that affects pregnant women (gestational diabetes) between 45 and 28 weeks. ? Rh antibodies. This is to check for a protein on red blood cells (Rh factor).  Urine tests to check for  infections, diabetes, or protein in the urine.  An ultrasound to confirm the proper growth and development of the baby.  An amniocentesis to check for possible genetic problems.  Fetal screens for spina bifida and Down syndrome.  HIV (human immunodeficiency virus) testing. Routine prenatal testing includes screening for HIV, unless you choose not to have this test.  Follow these instructions at home: Medicines  Follow your health care provider's instructions regarding medicine use. Specific medicines may be either safe or unsafe to take during pregnancy.  Take a prenatal vitamin that contains at least 600 micrograms (mcg) of folic acid.  If you develop constipation, try taking a stool softener if your health care provider approves. Eating and drinking  Eat a balanced diet that includes fresh fruits and vegetables, whole grains, good sources of protein such as meat, eggs, or tofu, and low-fat dairy. Your health care provider will help you determine the amount of weight gain that is right for you.  Avoid raw meat and uncooked cheese. These carry germs that can cause birth defects in the baby.  If you have low calcium intake from food, talk to your health care provider about whether you should take a daily calcium supplement.  Limit foods that are high in fat and processed sugars, such as fried and sweet foods.  To prevent constipation: ? Drink enough fluid to keep your urine clear or pale yellow. ? Eat foods that are high in fiber, such as fresh fruits and vegetables, whole grains, and beans. Activity  Exercise only as directed by your health care provider. Most women can continue their usual exercise routine during pregnancy. Try to exercise for 30 minutes at least 5 days a week. Stop exercising if you experience uterine contractions.  Avoid heavy lifting, wear low heel shoes, and practice good posture.  A sexual relationship may be continued unless your health care provider  directs you otherwise. Relieving pain and discomfort  Wear a good support bra to prevent discomfort from breast tenderness.  Take warm sitz baths to soothe any pain or discomfort caused by hemorrhoids. Use hemorrhoid cream if your health care provider approves.  Rest with your legs elevated if you have leg cramps or low back pain.  If you develop varicose veins, wear support hose. Elevate your feet for 15 minutes, 3-4 times a day. Limit salt in your diet. Prenatal Care  Write down your questions. Take them to your prenatal visits.  Keep all your prenatal visits as told by your health care provider. This is important. Safety  Wear your seat belt at all times when driving.  Make a list of emergency phone numbers, including numbers for family, friends, the hospital, and police and fire departments. General instructions  Ask your health care provider for a referral to a local prenatal education class. Begin classes no later than the beginning of month 6 of your pregnancy.  Ask for help if you have counseling or nutritional needs during pregnancy. Your health care provider can offer advice or refer you to  specialists for help with various needs.  Do not use hot tubs, steam rooms, or saunas.  Do not douche or use tampons or scented sanitary pads.  Do not cross your legs for long periods of time.  Avoid cat litter boxes and soil used by cats. These carry germs that can cause birth defects in the baby and possibly loss of the fetus by miscarriage or stillbirth.  Avoid all smoking, herbs, alcohol, and unprescribed drugs. Chemicals in these products can affect the formation and growth of the baby.  Do not use any products that contain nicotine or tobacco, such as cigarettes and e-cigarettes. If you need help quitting, ask your health care provider.  Visit your dentist if you have not gone yet during your pregnancy. Use a soft toothbrush to brush your teeth and be gentle when you  floss. Contact a health care provider if:  You have dizziness.  You have mild pelvic cramps, pelvic pressure, or nagging pain in the abdominal area.  You have persistent nausea, vomiting, or diarrhea.  You have a bad smelling vaginal discharge.  You have pain when you urinate. Get help right away if:  You have a fever.  You are leaking fluid from your vagina.  You have spotting or bleeding from your vagina.  You have severe abdominal cramping or pain.  You have rapid weight gain or weight loss.  You have shortness of breath with chest pain.  You notice sudden or extreme swelling of your face, hands, ankles, feet, or legs.  You have not felt your baby move in over an hour.  You have severe headaches that do not go away when you take medicine.  You have vision changes. Summary  The second trimester is from week 14 through week 27 (months 4 through 6). It is also a time when the fetus is growing rapidly.  Your body goes through many changes during pregnancy. The changes vary from woman to woman.  Avoid all smoking, herbs, alcohol, and unprescribed drugs. These chemicals affect the formation and growth your baby.  Do not use any tobacco products, such as cigarettes, chewing tobacco, and e-cigarettes. If you need help quitting, ask your health care provider.  Contact your health care provider if you have any questions. Keep all prenatal visits as told by your health care provider. This is important. This information is not intended to replace advice given to you by your health care provider. Make sure you discuss any questions you have with your health care provider. Document Released: 02/11/2001 Document Revised: 07/26/2015 Document Reviewed: 04/20/2012 Elsevier Interactive Patient Education  2017 [employer].

## 2016-09-24 NOTE — Progress Notes (Signed)
Low-risk OB appointment G2P1001 [redacted]w[redacted]d Estimated Date of Delivery: 03/12/17 BP 100/70   Pulse 80   Wt 165 lb (74.8 kg)   LMP 05/26/2016   BMI 27.46 kg/m   BP, weight, and urine reviewed.  Refer to obstetrical flow sheet for FH & FHR.  No fm yet. Denies cramping, lof, vb, or uti s/s. Some leg cramps, gave printed info.  Reviewed warning s/s to report. Plan:  Continue routine obstetrical care  F/U in 2wks for OB appointment and anatomy u/s Repeat cmp today, Cr was elevated at 1.25 at new ob, states she's been chugging water yesterday and today

## 2016-09-25 ENCOUNTER — Telehealth: Payer: Self-pay | Admitting: Women's Health

## 2016-09-25 LAB — COMPREHENSIVE METABOLIC PANEL
A/G RATIO: 1.5 (ref 1.2–2.2)
ALK PHOS: 117 IU/L (ref 39–117)
ALT: 45 IU/L — ABNORMAL HIGH (ref 0–32)
AST: 33 IU/L (ref 0–40)
Albumin: 3.7 g/dL (ref 3.5–5.5)
BUN/Creatinine Ratio: 16 (ref 9–23)
BUN: 8 mg/dL (ref 6–20)
CALCIUM: 8.8 mg/dL (ref 8.7–10.2)
CHLORIDE: 100 mmol/L (ref 96–106)
CO2: 21 mmol/L (ref 20–29)
Creatinine, Ser: 0.5 mg/dL — ABNORMAL LOW (ref 0.57–1.00)
GFR calc Af Amer: 145 mL/min/{1.73_m2} (ref >59)
GFR, EST NON AFRICAN AMERICAN: 126 mL/min/{1.73_m2} (ref >59)
GLOBULIN, TOTAL: 2.5 g/dL (ref 1.5–4.5)
GLUCOSE: 83 mg/dL (ref 65–99)
POTASSIUM: 5.1 mmol/L (ref 3.5–5.2)
SODIUM: 135 mmol/L (ref 134–144)
Total Protein: 6.2 g/dL (ref 6.0–8.5)

## 2016-09-25 NOTE — Telephone Encounter (Signed)
Called pt, notified her of normal creatinine. Slightly elevated ALT @ 45, states she has taken some apap recently.  Cheral MarkerKimberly R. Tian Mcmurtrey, CNM, [facility] 09/25/2016 4:40 PM

## 2016-10-09 ENCOUNTER — Encounter: Payer: Self-pay | Admitting: Obstetrics & Gynecology

## 2016-10-09 ENCOUNTER — Ambulatory Visit (INDEPENDENT_AMBULATORY_CARE_PROVIDER_SITE_OTHER): Payer: BLUE CROSS/BLUE SHIELD | Admitting: Obstetrics & Gynecology

## 2016-10-09 ENCOUNTER — Ambulatory Visit (INDEPENDENT_AMBULATORY_CARE_PROVIDER_SITE_OTHER): Payer: BLUE CROSS/BLUE SHIELD

## 2016-10-09 VITALS — BP 120/72 | HR 70 | Wt 169.0 lb

## 2016-10-09 DIAGNOSIS — Z3482 Encounter for supervision of other normal pregnancy, second trimester: Secondary | ICD-10-CM

## 2016-10-09 DIAGNOSIS — Z1389 Encounter for screening for other disorder: Secondary | ICD-10-CM

## 2016-10-09 DIAGNOSIS — Z331 Pregnant state, incidental: Secondary | ICD-10-CM

## 2016-10-09 DIAGNOSIS — Z3A18 18 weeks gestation of pregnancy: Secondary | ICD-10-CM

## 2016-10-09 DIAGNOSIS — Z363 Encounter for antenatal screening for malformations: Secondary | ICD-10-CM

## 2016-10-09 LAB — POCT URINALYSIS DIPSTICK
GLUCOSE UA: NEGATIVE
KETONES UA: NEGATIVE
Nitrite, UA: NEGATIVE
Protein, UA: NEGATIVE
RBC UA: NEGATIVE

## 2016-10-09 NOTE — Progress Notes (Signed)
US 18 wks,breech,cx 4.5 cm,normal ovaries bilat,post fundal pl gr 0,svp of fluid 5.7cm,fhr 141 bpm,efw 269 g,limited view of spine because of fetal position,please have pt come back for additional images,no obvious abnormalities

## 2016-10-09 NOTE — Progress Notes (Signed)
G2P1001 7050w0d Estimated Date of Delivery: 03/12/17  Blood pressure 120/72, pulse 70, weight 169 lb (76.7 kg), last menstrual period 05/26/2016.   BP weight and urine results all reviewed and noted.  Please refer to the obstetrical flow sheet for the fundal height and fetal heart rate documentation:  Patient reports good fetal movement, denies any bleeding and no rupture of membranes symptoms or regular contractions. Patient is without complaints. All questions were answered.  Orders Placed This Encounter  Procedures  . US OB Limited  . POCT Urinalysis Dipstick    Plan:  Continued routine obstetrical care, discussed TOLAC tubal etc Sonogram is normal lumbar views are suboptimal  Return in about 4 weeks (around 11/06/2016) for sonogram repeat, LROB, with Dr Despina HiddenEure.

## 2016-11-05 ENCOUNTER — Other Ambulatory Visit: Payer: Self-pay | Admitting: Obstetrics & Gynecology

## 2016-11-05 DIAGNOSIS — Z0489 Encounter for examination and observation for other specified reasons: Secondary | ICD-10-CM

## 2016-11-05 DIAGNOSIS — IMO0002 Reserved for concepts with insufficient information to code with codable children: Secondary | ICD-10-CM

## 2016-11-06 ENCOUNTER — Encounter: Payer: Self-pay | Admitting: Obstetrics & Gynecology

## 2016-11-06 ENCOUNTER — Ambulatory Visit (INDEPENDENT_AMBULATORY_CARE_PROVIDER_SITE_OTHER): Payer: BLUE CROSS/BLUE SHIELD | Admitting: Obstetrics & Gynecology

## 2016-11-06 ENCOUNTER — Ambulatory Visit (INDEPENDENT_AMBULATORY_CARE_PROVIDER_SITE_OTHER): Payer: BLUE CROSS/BLUE SHIELD

## 2016-11-06 VITALS — BP 76/60 | HR 78 | Wt 177.0 lb

## 2016-11-06 DIAGNOSIS — Z363 Encounter for antenatal screening for malformations: Secondary | ICD-10-CM | POA: Diagnosis not present

## 2016-11-06 DIAGNOSIS — Z331 Pregnant state, incidental: Secondary | ICD-10-CM

## 2016-11-06 DIAGNOSIS — IMO0002 Reserved for concepts with insufficient information to code with codable children: Secondary | ICD-10-CM

## 2016-11-06 DIAGNOSIS — Z3A22 22 weeks gestation of pregnancy: Secondary | ICD-10-CM

## 2016-11-06 DIAGNOSIS — Z3482 Encounter for supervision of other normal pregnancy, second trimester: Secondary | ICD-10-CM

## 2016-11-06 DIAGNOSIS — Z1389 Encounter for screening for other disorder: Secondary | ICD-10-CM

## 2016-11-06 DIAGNOSIS — Z0489 Encounter for examination and observation for other specified reasons: Secondary | ICD-10-CM

## 2016-11-06 LAB — POCT URINALYSIS DIPSTICK
Blood, UA: NEGATIVE
Glucose, UA: NEGATIVE
Ketones, UA: NEGATIVE
LEUKOCYTES UA: NEGATIVE
NITRITE UA: NEGATIVE
Protein, UA: NEGATIVE

## 2016-11-06 NOTE — Progress Notes (Signed)
G2P1001 3933w0d Estimated Date of Delivery: 03/12/17  Blood pressure (!) 76/60, pulse 78, weight 177 lb (80.3 kg), last menstrual period 05/26/2016.   BP weight and urine results all reviewed and noted.  Please refer to the obstetrical flow sheet for the fundal height and fetal heart rate documentation:  Patient reports good fetal movement, denies any bleeding and no rupture of membranes symptoms or regular contractions. Patient is without complaints. All questions were answered.  Orders Placed This Encounter  Procedures  . POCT urinalysis dipstick    Plan:  Continued routine obstetrical care, sonogram is normal, with good spine views today  Return in about 4 weeks (around 12/04/2016) for PN2, , LROB.

## 2016-11-06 NOTE — Progress Notes (Signed)
US 22 wks,cephalic,posterior fundal pl gr 0,normal ovaries bilat,cx 4.5 cm,svp of fluid 5.6 cm,fhr 135 bpm,EFW 609 g 70%,AC 93%,BPD 97%,HC 91%,anatomy of the spine complete

## 2016-12-04 ENCOUNTER — Ambulatory Visit (INDEPENDENT_AMBULATORY_CARE_PROVIDER_SITE_OTHER): Payer: BLUE CROSS/BLUE SHIELD | Admitting: Advanced Practice Midwife

## 2016-12-04 ENCOUNTER — Other Ambulatory Visit: Payer: BLUE CROSS/BLUE SHIELD

## 2016-12-04 VITALS — BP 118/70 | HR 82 | Wt 188.0 lb

## 2016-12-04 DIAGNOSIS — Z3A26 26 weeks gestation of pregnancy: Secondary | ICD-10-CM

## 2016-12-04 DIAGNOSIS — Z331 Pregnant state, incidental: Secondary | ICD-10-CM

## 2016-12-04 DIAGNOSIS — Z3482 Encounter for supervision of other normal pregnancy, second trimester: Secondary | ICD-10-CM

## 2016-12-04 DIAGNOSIS — Z1389 Encounter for screening for other disorder: Secondary | ICD-10-CM

## 2016-12-04 DIAGNOSIS — Z131 Encounter for screening for diabetes mellitus: Secondary | ICD-10-CM | POA: Diagnosis not present

## 2016-12-04 LAB — POCT URINALYSIS DIPSTICK
Blood, UA: NEGATIVE
Glucose, UA: NEGATIVE
Ketones, UA: NEGATIVE
NITRITE UA: NEGATIVE
Protein, UA: NEGATIVE

## 2016-12-04 NOTE — Patient Instructions (Addendum)

## 2016-12-04 NOTE — Progress Notes (Signed)
G2P1001 [redacted]w[redacted]d Estimated Date of Delivery: 03/12/17  Blood pressure 118/70, pulse 82, weight 188 lb (85.3 kg), last menstrual period 05/26/2016.   BP weight and urine results all reviewed and noted.  Please refer to the obstetrical flow sheet for the fundal height and fetal heart rate documentation:  Patient reports good fetal movement, denies any bleeding and no rupture of membranes symptoms or regular contractions. Patient is without complaints. Discussed TOLAC , wants to try.  CS for "failure to descend".  Has SOL, can't remember how far dilated she got. Baby was only 6#6oz.  Will check efw/pelvic exam 36-37 weeks All questions were answered.  Orders Placed This Encounter  Procedures  . POCT Urinalysis Dipstick    Plan:  Continued routine obstetrical care, PN2 today  Return in about 3 weeks (around 12/25/2016) for LROB.

## 2016-12-05 LAB — CBC
Hematocrit: 35.5 % (ref 34.0–46.6)
Hemoglobin: 11.6 g/dL (ref 11.1–15.9)
MCH: 29.5 pg (ref 26.6–33.0)
MCHC: 32.7 g/dL (ref 31.5–35.7)
MCV: 90 fL (ref 79–97)
Platelets: 286 x10E3/uL (ref 150–379)
RBC: 3.93 x10E6/uL (ref 3.77–5.28)
RDW: 13.1 % (ref 12.3–15.4)
WBC: 12.2 x10E3/uL — ABNORMAL HIGH (ref 3.4–10.8)

## 2016-12-05 LAB — HIV ANTIBODY (ROUTINE TESTING W REFLEX): HIV Screen 4th Generation wRfx: NONREACTIVE

## 2016-12-05 LAB — GLUCOSE TOLERANCE, 2 HOURS W/ 1HR
GLUCOSE, FASTING: 81 mg/dL (ref 65–91)
Glucose, 1 hour: 166 mg/dL (ref 65–179)
Glucose, 2 hour: 87 mg/dL (ref 65–152)

## 2016-12-05 LAB — RPR: RPR Ser Ql: NONREACTIVE

## 2016-12-05 LAB — ANTIBODY SCREEN: Antibody Screen: NEGATIVE

## 2016-12-25 ENCOUNTER — Ambulatory Visit (INDEPENDENT_AMBULATORY_CARE_PROVIDER_SITE_OTHER): Payer: BLUE CROSS/BLUE SHIELD | Admitting: Advanced Practice Midwife

## 2016-12-25 ENCOUNTER — Encounter: Payer: Self-pay | Admitting: Advanced Practice Midwife

## 2016-12-25 VITALS — BP 120/88 | HR 66 | Wt 196.0 lb

## 2016-12-25 DIAGNOSIS — B9689 Other specified bacterial agents as the cause of diseases classified elsewhere: Secondary | ICD-10-CM

## 2016-12-25 DIAGNOSIS — Z23 Encounter for immunization: Secondary | ICD-10-CM | POA: Diagnosis not present

## 2016-12-25 DIAGNOSIS — N760 Acute vaginitis: Secondary | ICD-10-CM

## 2016-12-25 DIAGNOSIS — Z3A29 29 weeks gestation of pregnancy: Secondary | ICD-10-CM

## 2016-12-25 DIAGNOSIS — Z1389 Encounter for screening for other disorder: Secondary | ICD-10-CM

## 2016-12-25 DIAGNOSIS — Z331 Pregnant state, incidental: Secondary | ICD-10-CM | POA: Diagnosis not present

## 2016-12-25 DIAGNOSIS — O98813 Other maternal infectious and parasitic diseases complicating pregnancy, third trimester: Secondary | ICD-10-CM

## 2016-12-25 DIAGNOSIS — Z3483 Encounter for supervision of other normal pregnancy, third trimester: Secondary | ICD-10-CM

## 2016-12-25 LAB — POCT URINALYSIS DIPSTICK
Blood, UA: NEGATIVE
GLUCOSE UA: NEGATIVE
Ketones, UA: NEGATIVE
Leukocytes, UA: NEGATIVE
NITRITE UA: NEGATIVE
Protein, UA: NEGATIVE

## 2016-12-25 MED ORDER — METRONIDAZOLE 500 MG PO TABS: 500.0000 mg | ORAL_TABLET | Freq: Two times a day (BID) | ORAL | 0 refills | Status: DC

## 2016-12-25 NOTE — Progress Notes (Signed)
G2P1001 7238w0d Estimated Date of Delivery: 03/12/17  Blood pressure 120/88, pulse 66, weight 196 lb (88.9 kg), last menstrual period 05/26/2016.   BP weight and urine results all reviewed and noted.  Please refer to the obstetrical flow sheet for the fundal height and fetal heart rate documentation:s  Patient reports good fetal movement, denies any bleeding and no rupture of membranes symptoms or regular contractions. Patient has noticed and increased clear discharge. For a few weeks. SSE:  White frothy discharge, vagina red.  No pooling, neg valsalva and fern.  Wet prep + clue.   All questions were answered.  Orders Placed This Encounter  Procedures  . POCT urinalysis dipstick    Plan:  Continued routine obstetrical care, Treat BV  Return in about 3 weeks (around 01/15/2017) for LROB.

## 2016-12-25 NOTE — Patient Instructions (Addendum)
Call the office ([phone]) or go to [facility] if:  You begin to have strong, frequent contractions  Your water breaks.  Sometimes it is a big gush of fluid, sometimes it is just a trickle that keeps getting your panties wet or running down your legs  You have vaginal bleeding.  It is normal to have a small amount of spotting if your cervix was checked.   You don't feel your baby moving like normal.  If you don't, get you something to eat and drink and lay down and focus on feeling your baby move.  You should feel at least 10 movements in 2 hours.  If you don't, you should call the office or go to [facility].    Tdap Vaccine  It is recommended that you get the Tdap vaccine during the third trimester of EACH pregnancy to help protect your baby from getting pertussis (whooping cough)  27-36 weeks is the BEST time to do this so that you can pass the protection on to your baby. During pregnancy is better than after pregnancy, but if you are unable to get it during pregnancy it will be offered at the hospital.   You can get this vaccine at the health department or your family doctor  Everyone who will be around your baby should also be up-to-date on their vaccines. Adults (who are not pregnant) only need 1 dose of Tdap during adulthood.   Third Trimester of Pregnancy The third trimester is from week 29 through week 42, months 7 through 9. The third trimester is a time when the fetus is growing rapidly. At the end of the ninth month, the fetus is about 20 inches in length and weighs 6-10 pounds.  BODY CHANGES Your body goes through many changes during pregnancy. The changes vary from woman to woman.   Your weight will continue to increase. You can expect to gain 25-35 pounds (11-16 kg) by the end of the pregnancy.  You may begin to get stretch marks on your hips, abdomen, and breasts.  You may urinate more often because the fetus is moving lower into your pelvis and pressing on  your bladder.  You may develop or continue to have heartburn as a result of your pregnancy.  You may develop constipation because certain hormones are causing the muscles that push waste through your intestines to slow down.  You may develop hemorrhoids or swollen, bulging veins (varicose veins).  You may have pelvic pain because of the weight gain and pregnancy hormones relaxing your joints between the bones in your pelvis. Backaches may result from overexertion of the muscles supporting your posture.  You may have changes in your hair. These can include thickening of your hair, rapid growth, and changes in texture. Some women also have hair loss during or after pregnancy, or hair that feels dry or thin. Your hair will most likely return to normal after your baby is born.  Your breasts will continue to grow and be tender. A yellow discharge may leak from your breasts called colostrum.  Your belly button may stick out.  You may feel short of breath because of your expanding uterus.  You may notice the fetus "dropping," or moving lower in your abdomen.  You may have a bloody mucus discharge. This usually occurs a few days to a week before labor begins.  Your cervix becomes thin and soft (effaced) near your due date. WHAT TO EXPECT AT YOUR PRENATAL EXAMS  You will have prenatal   exams every 2 weeks until week 36. Then, you will have weekly prenatal exams. During a routine prenatal visit:  You will be weighed to make sure you and the fetus are growing normally.  Your blood pressure is taken.  Your abdomen will be measured to track your baby's growth.  The fetal heartbeat will be listened to.  Any test results from the previous visit will be discussed.  You may have a cervical check near your due date to see if you have effaced. At around 36 weeks, your caregiver will check your cervix. At the same time, your caregiver will also perform a test on the secretions of the vaginal tissue.  This test is to determine if a type of bacteria, Group B streptococcus, is present. Your caregiver will explain this further. Your caregiver may ask you:  What your birth plan is.  How you are feeling.  If you are feeling the baby move.  If you have had any abnormal symptoms, such as leaking fluid, bleeding, severe headaches, or abdominal cramping.  If you have any questions. Other tests or screenings that may be performed during your third trimester include:  Blood tests that check for low iron levels (anemia).  Fetal testing to check the health, activity level, and growth of the fetus. Testing is done if you have certain medical conditions or if there are problems during the pregnancy. FALSE LABOR You may feel small, irregular contractions that eventually go away. These are called Braxton Hicks contractions, or false labor. Contractions may last for hours, days, or even weeks before true labor sets in. If contractions come at regular intervals, intensify, or become painful, it is best to be seen by your caregiver.  SIGNS OF LABOR   Menstrual-like cramps.  Contractions that are 5 minutes apart or less.  Contractions that start on the top of the uterus and spread down to the lower abdomen and back.  A sense of increased pelvic pressure or back pain.  A watery or bloody mucus discharge that comes from the vagina. If you have any of these signs before the 37th week of pregnancy, call your caregiver right away. You need to go to the hospital to get checked immediately. HOME CARE INSTRUCTIONS   Avoid all smoking, herbs, alcohol, and unprescribed drugs. These chemicals affect the formation and growth of the baby.  Follow your caregiver's instructions regarding medicine use. There are medicines that are either safe or unsafe to take during pregnancy.  Exercise only as directed by your caregiver. Experiencing uterine cramps is a good sign to stop exercising.  Continue to eat regular,  healthy meals.  Wear a good support bra for breast tenderness.  Do not use hot tubs, steam rooms, or saunas.  Wear your seat belt at all times when driving.  Avoid raw meat, uncooked cheese, cat litter boxes, and soil used by cats. These carry germs that can cause birth defects in the baby.  Take your prenatal vitamins.  Try taking a stool softener (if your caregiver approves) if you develop constipation. Eat more high-fiber foods, such as fresh vegetables or fruit and whole grains. Drink plenty of fluids to keep your urine clear or pale yellow.  Take warm sitz baths to soothe any pain or discomfort caused by hemorrhoids. Use hemorrhoid cream if your caregiver approves.  If you develop varicose veins, wear support hose. Elevate your feet for 15 minutes, 3-4 times a day. Limit salt in your diet.  Avoid heavy lifting, wear low heal   shoes, and practice good posture.  Rest a lot with your legs elevated if you have leg cramps or low back pain.  Visit your dentist if you have not gone during your pregnancy. Use a soft toothbrush to brush your teeth and be gentle when you floss.  A sexual relationship may be continued unless your caregiver directs you otherwise.  Do not travel far distances unless it is absolutely necessary and only with the approval of your caregiver.  Take prenatal classes to understand, practice, and ask questions about the labor and delivery.  Make a trial run to the hospital.  Pack your hospital bag.  Prepare the baby's nursery.  Continue to go to all your prenatal visits as directed by your caregiver. SEEK MEDICAL CARE IF:  You are unsure if you are in labor or if your water has broken.  You have dizziness.  You have mild pelvic cramps, pelvic pressure, or nagging pain in your abdominal area.  You have persistent nausea, vomiting, or diarrhea.  You have a bad smelling vaginal discharge.  You have pain with urination. SEEK IMMEDIATE MEDICAL CARE IF:    You have a fever.  You are leaking fluid from your vagina.  You have spotting or bleeding from your vagina.  You have severe abdominal cramping or pain.  You have rapid weight loss or gain.  You have shortness of breath with chest pain.  You notice sudden or extreme swelling of your face, hands, ankles, feet, or legs.  You have not felt your baby move in over an hour.  You have severe headaches that do not go away with medicine.  You have vision changes. Document Released: 02/11/2001 Document Revised: 02/22/2013 Document Reviewed: 04/20/2012 [facility] Patient Information 2015 [place], [place]. This information is not intended to replace advice given to you by your health care provider. Make sure you discuss any questions you have with your health care provider.   Bacterial Vaginosis Bacterial vaginosis is a vaginal infection that occurs when the normal balance of bacteria in the vagina is disrupted. It results from an overgrowth of certain bacteria. This is the most common vaginal infection among women ages 43-44. Because bacterial vaginosis increases your risk for STIs (sexually transmitted infections), getting treated can help reduce your risk for chlamydia, gonorrhea, herpes, and HIV (human immunodeficiency virus). Treatment is also important for preventing complications in pregnant women, because this condition can cause an early (premature) delivery. What are the causes? This condition is caused by an increase in harmful bacteria that are normally present in small amounts in the vagina. However, the reason that the condition develops is not fully understood. What increases the risk? The following factors may make you more likely to develop this condition:  Having a new sexual partner or multiple sexual partners.  Having unprotected sex.  Douching.  Having an intrauterine device (IUD).  Smoking.  Drug and alcohol abuse.  Taking certain antibiotic medicines.  Being  pregnant.  You cannot get bacterial vaginosis from toilet seats, bedding, swimming pools, or contact with objects around you. What are the signs or symptoms? Symptoms of this condition include:  Grey or white vaginal discharge. The discharge can also be watery or foamy.  A fish-like odor with discharge, especially after sexual intercourse or during menstruation.  Itching in and around the vagina.  Burning or pain with urination.  Some women with bacterial vaginosis have no signs or symptoms. How is this diagnosed? This condition is diagnosed based on:  Your medical history.  A physical exam of the vagina.  Testing a sample of vaginal fluid under a microscope to look for a large amount of bad bacteria or abnormal cells. Your health care provider may use a cotton swab or a small wooden spatula to collect the sample.  How is this treated? This condition is treated with antibiotics. These may be given as a pill, a vaginal cream, or a medicine that is put into the vagina (suppository). If the condition comes back after treatment, a second round of antibiotics may be needed. Follow these instructions at home: Medicines  Take over-the-counter and prescription medicines only as told by your health care provider.  Take or use your antibiotic as told by your health care provider. Do not stop taking or using the antibiotic even if you start to feel better. General instructions  If you have a female sexual partner, tell her that you have a vaginal infection. She should see her health care provider and be treated if she has symptoms. If you have a female sexual partner, he does not need treatment.  During treatment: ? Avoid sexual activity until you finish treatment. ? Do not douche. ? Avoid alcohol as directed by your health care provider. ? Avoid breastfeeding as directed by your health care provider.  Drink enough water and fluids to keep your urine clear or pale yellow.  Keep the  area around your vagina and rectum clean. ? Wash the area daily with warm water. ? Wipe yourself from front to back after using the toilet.  Keep all follow-up visits as told by your health care provider. This is important. How is this prevented?  Do not douche.  Wash the outside of your vagina with warm water only.  Use protection when having sex. This includes latex condoms and dental dams.  Limit how many sexual partners you have. To help prevent bacterial vaginosis, it is best to have sex with just one partner (monogamous).  Make sure you and your sexual partner are tested for STIs.  Wear cotton or cotton-lined underwear.  Avoid wearing tight pants and pantyhose, especially during summer.  Limit the amount of alcohol that you drink.  Do not use any products that contain nicotine or tobacco, such as cigarettes and e-cigarettes. If you need help quitting, ask your health care provider.  Do not use illegal drugs. Where to find more information:  Centers for Disease Control and Prevention: SolutionApps.co.zawww.cdc.gov/std  American Sexual Health Association (ASHA): www.ashastd.org  U.S. Department of Health and Health and safety inspectorHuman Services, Office on Women's Health: ConventionalMedicines.siwww.womenshealth.gov/ or http://www.anderson-williamson.info/https://www.womenshealth.gov/a-z-topics/bacterial-vaginosis Contact a health care provider if:  Your symptoms do not improve, even after treatment.  You have more discharge or pain when urinating.  You have a fever.  You have pain in your abdomen.  You have pain during sex.  You have vaginal bleeding between periods. Summary  Bacterial vaginosis is a vaginal infection that occurs when the normal balance of bacteria in the vagina is disrupted.  Because bacterial vaginosis increases your risk for STIs (sexually transmitted infections), getting treated can help reduce your risk for chlamydia, gonorrhea, herpes, and HIV (human immunodeficiency virus). Treatment is also important for preventing complications in  pregnant women, because the condition can cause an early (premature) delivery.  This condition is treated with antibiotic medicines. These may be given as a pill, a vaginal cream, or a medicine that is put into the vagina (suppository). This information is not intended to replace advice given to you by your health care provider.  Make sure you discuss any questions you have with your health care provider. Document Released: 02/17/2005 Document Revised: 11/03/2015 Document Reviewed: 11/03/2015 Elsevier Interactive Patient Education  2017 [employer].

## 2017-01-15 ENCOUNTER — Encounter: Payer: Self-pay | Admitting: Women's Health

## 2017-01-15 ENCOUNTER — Ambulatory Visit (INDEPENDENT_AMBULATORY_CARE_PROVIDER_SITE_OTHER): Payer: BLUE CROSS/BLUE SHIELD | Admitting: Women's Health

## 2017-01-15 VITALS — BP 110/72 | HR 75 | Wt 206.0 lb

## 2017-01-15 DIAGNOSIS — O288 Other abnormal findings on antenatal screening of mother: Secondary | ICD-10-CM

## 2017-01-15 DIAGNOSIS — F1290 Cannabis use, unspecified, uncomplicated: Secondary | ICD-10-CM

## 2017-01-15 DIAGNOSIS — Z3A32 32 weeks gestation of pregnancy: Secondary | ICD-10-CM | POA: Diagnosis not present

## 2017-01-15 DIAGNOSIS — Z331 Pregnant state, incidental: Secondary | ICD-10-CM

## 2017-01-15 DIAGNOSIS — R82998 Other abnormal findings in urine: Secondary | ICD-10-CM

## 2017-01-15 DIAGNOSIS — Z1389 Encounter for screening for other disorder: Secondary | ICD-10-CM

## 2017-01-15 DIAGNOSIS — Z3483 Encounter for supervision of other normal pregnancy, third trimester: Secondary | ICD-10-CM

## 2017-01-15 LAB — POCT URINALYSIS DIPSTICK
GLUCOSE UA: NEGATIVE
Ketones, UA: NEGATIVE
NITRITE UA: NEGATIVE
Protein, UA: NEGATIVE
RBC UA: NEGATIVE

## 2017-01-15 NOTE — Progress Notes (Signed)
LOW-RISK PREGNANCY VISIT Patient name: [name] MRN [id number]  Date of birth: 03/30/1980 Chief Complaint:   Routine Prenatal Visit (+ pressure)  History of Present Illness:   [name] is a 36 y.o. 672P1001 female at 8539w0d with an Estimated Date of Delivery: 03/12/17 being seen today for ongoing management of a low-risk pregnancy.  Today she reports some pressure, rare uc's. Denies regular uc's, lof, vb, uti s/s, abnormal d/c, itching/odor/irritation. Still undecided about TOLAC vs RCS, discussed some more, will decide between now and next visit. Wants BTL, discussed risks/benefits- consent signed today. No THC since early pregnancy. Wants in-hospital circ, knows she will have to pay out of pocket d/t preg mcaid as secondary. Contractions: Irregular. Vag. Bleeding: None.  Movement: Present. denies leaking of fluid. Review of Systems:   Pertinent items are noted in HPI Denies abnormal vaginal discharge w/ itching/odor/irritation, headaches, visual changes, shortness of breath, chest pain, abdominal pain, severe nausea/vomiting, or problems with urination or bowel movements unless otherwise stated above. Pertinent History Reviewed:  Reviewed past medical,surgical, social, obstetrical and family history.  Reviewed problem list, medications and allergies. Physical Assessment:   Vitals:   01/15/17 0847  BP: 110/72  Pulse: 75  Weight: 206 lb (93.4 kg)  Body mass index is 34.28 kg/m.        Physical Examination:   General appearance: Well appearing, and in no distress  Mental status: Alert, oriented to person, place, and time  Skin: Warm & dry  Cardiovascular: Normal heart rate noted  Respiratory: Normal respiratory effort, no distress  Abdomen: Soft, gravid, nontender  Pelvic: Cervical exam deferred         Extremities: Edema: None  Fetal Status: Fetal Heart Rate (bpm): 135 Fundal Height: 34 cm Movement: Present    Results for orders placed or performed in visit on  01/15/17 (from the past 24 hour(s))  POCT urinalysis dipstick   Collection Time: 01/15/17  8:48 AM  Result Value Ref Range   Color, UA     Clarity, UA     Glucose, UA neg    Bilirubin, UA     Ketones, UA neg    Spec Grav, UA  1.010 - 1.025   Blood, UA neg    pH, UA  5.0 - 8.0   Protein, UA neg    Urobilinogen, UA  0.2 or 1.0 E.U./dL   Nitrite, UA neg    Leukocytes, UA Moderate (2+) (A) Negative    Assessment & Plan:  1) Low-risk pregnancy G2P1001 at 6739w0d with an Estimated Date of Delivery: 03/12/17   2) Prev c/s, still deciding TOLAC vs RCS, discussed risks/benefits of both, wants to think a little more, will let us know next visit- will make visit w/ MD in case decides for C/S  3) THC+ earlier pregnancy> none since +PT, repeat today  4) Leuks urine> send urine cx  5) Pressure> can try pregnancy belt  6) Desires BTL> discussed risks/benefits, consent signed today  Plan:  Continue routine obstetrical care   Reviewed: Preterm labor symptoms and general obstetric precautions including but not limited to vaginal bleeding, contractions, leaking of fluid and fetal movement were reviewed in detail with the patient.  Recommended Tdap at HD/PCP per CDC guidelines. All questions were answered  Follow-up: Return in about 2 weeks (around 01/29/2017) for LROB w/ MD.  Orders Placed This Encounter  Procedures  . Urine Culture  . Pain Management Screening Profile (10S)  . POCT urinalysis dipstick   Grace BushyBooker,  Cheron EveryKimberly Randall CNM, [facility]NP[facility] 01/15/2017 9:25 AM

## 2017-01-15 NOTE — Patient Instructions (Signed)
Marissa CaffeyAmanda R Skinner, I greatly value your feedback.  If you receive a survey following your visit with us today, we appreciate you taking the time to fill it out.  Thanks, Joellyn HaffKim Awilda Covin, CNM, WHNP-BC   Call the office [phone]) or go to [facility] if:  You begin to have strong, frequent contractions  Your water breaks.  Sometimes it is a big gush of fluid, sometimes it is just a trickle that keeps getting your panties wet or running down your legs  You have vaginal bleeding.  It is normal to have a small amount of spotting if your cervix was checked.   You don't feel your baby moving like normal.  If you don't, get you something to eat and drink and lay down and focus on feeling your baby move.  You should feel at least 10 movements in 2 hours.  If you don't, you should call the office or go to [facility].    Tdap Vaccine  It is recommended that you get the Tdap vaccine during the third trimester of EACH pregnancy to help protect your baby from getting pertussis (whooping cough)  27-36 weeks is the BEST time to do this so that you can pass the protection on to your baby. During pregnancy is better than after pregnancy, but if you are unable to get it during pregnancy it will be offered at the hospital.   You can get this vaccine at the health department or your family doctor  Everyone who will be around your baby should also be up-to-date on their vaccines. Adults (who are not pregnant) only need 1 dose of Tdap during adulthood.   Third Trimester of Pregnancy The third trimester is from week 29 through week 42, months 7 through 9. The third trimester is a time when the fetus is growing rapidly. At the end of the ninth month, the fetus is about 20 inches in length and weighs 6-10 pounds.  BODY CHANGES Your body goes through many changes during pregnancy. The changes vary from woman to woman.   Your weight will continue to increase. You can expect to gain 25-35 pounds (11-16 kg) by  the end of the pregnancy.  You may begin to get stretch marks on your hips, abdomen, and breasts.  You may urinate more often because the fetus is moving lower into your pelvis and pressing on your bladder.  You may develop or continue to have heartburn as a result of your pregnancy.  You may develop constipation because certain hormones are causing the muscles that push waste through your intestines to slow down.  You may develop hemorrhoids or swollen, bulging veins (varicose veins).  You may have pelvic pain because of the weight gain and pregnancy hormones relaxing your joints between the bones in your pelvis. Backaches may result from overexertion of the muscles supporting your posture.  You may have changes in your hair. These can include thickening of your hair, rapid growth, and changes in texture. Some women also have hair loss during or after pregnancy, or hair that feels dry or thin. Your hair will most likely return to normal after your baby is born.  Your breasts will continue to grow and be tender. A yellow discharge may leak from your breasts called colostrum.  Your belly button may stick out.  You may feel short of breath because of your expanding uterus.  You may notice the fetus "dropping," or moving lower in your abdomen.  You may have a bloody  mucus discharge. This usually occurs a few days to a week before labor begins.  Your cervix becomes thin and soft (effaced) near your due date. WHAT TO EXPECT AT YOUR PRENATAL EXAMS  You will have prenatal exams every 2 weeks until week 36. Then, you will have weekly prenatal exams. During a routine prenatal visit:  You will be weighed to make sure you and the fetus are growing normally.  Your blood pressure is taken.  Your abdomen will be measured to track your baby's growth.  The fetal heartbeat will be listened to.  Any test results from the previous visit will be discussed.  You may have a cervical check near your  due date to see if you have effaced. At around 36 weeks, your caregiver will check your cervix. At the same time, your caregiver will also perform a test on the secretions of the vaginal tissue. This test is to determine if a type of bacteria, Group B streptococcus, is present. Your caregiver will explain this further. Your caregiver may ask you:  What your birth plan is.  How you are feeling.  If you are feeling the baby move.  If you have had any abnormal symptoms, such as leaking fluid, bleeding, severe headaches, or abdominal cramping.  If you have any questions. Other tests or screenings that may be performed during your third trimester include:  Blood tests that check for low iron levels (anemia).  Fetal testing to check the health, activity level, and growth of the fetus. Testing is done if you have certain medical conditions or if there are problems during the pregnancy. FALSE LABOR You may feel small, irregular contractions that eventually go away. These are called Braxton Hicks contractions, or false labor. Contractions may last for hours, days, or even weeks before true labor sets in. If contractions come at regular intervals, intensify, or become painful, it is best to be seen by your caregiver.  SIGNS OF LABOR   Menstrual-like cramps.  Contractions that are 5 minutes apart or less.  Contractions that start on the top of the uterus and spread down to the lower abdomen and back.  A sense of increased pelvic pressure or back pain.  A watery or bloody mucus discharge that comes from the vagina. If you have any of these signs before the 37th week of pregnancy, call your caregiver right away. You need to go to the hospital to get checked immediately. HOME CARE INSTRUCTIONS   Avoid all smoking, herbs, alcohol, and unprescribed drugs. These chemicals affect the formation and growth of the baby.  Follow your caregiver's instructions regarding medicine use. There are medicines  that are either safe or unsafe to take during pregnancy.  Exercise only as directed by your caregiver. Experiencing uterine cramps is a good sign to stop exercising.  Continue to eat regular, healthy meals.  Wear a good support bra for breast tenderness.  Do not use hot tubs, steam rooms, or saunas.  Wear your seat belt at all times when driving.  Avoid raw meat, uncooked cheese, cat litter boxes, and soil used by cats. These carry germs that can cause birth defects in the baby.  Take your prenatal vitamins.  Try taking a stool softener (if your caregiver approves) if you develop constipation. Eat more high-fiber foods, such as fresh vegetables or fruit and whole grains. Drink plenty of fluids to keep your urine clear or pale yellow.  Take warm sitz baths to soothe any pain or discomfort caused by hemorrhoids.  Use hemorrhoid cream if your caregiver approves.  If you develop varicose veins, wear support hose. Elevate your feet for 15 minutes, 3-4 times a day. Limit salt in your diet.  Avoid heavy lifting, wear low heal shoes, and practice good posture.  Rest a lot with your legs elevated if you have leg cramps or low back pain.  Visit your dentist if you have not gone during your pregnancy. Use a soft toothbrush to brush your teeth and be gentle when you floss.  A sexual relationship may be continued unless your caregiver directs you otherwise.  Do not travel far distances unless it is absolutely necessary and only with the approval of your caregiver.  Take prenatal classes to understand, practice, and ask questions about the labor and delivery.  Make a trial run to the hospital.  Pack your hospital bag.  Prepare the baby's nursery.  Continue to go to all your prenatal visits as directed by your caregiver. SEEK MEDICAL CARE IF:  You are unsure if you are in labor or if your water has broken.  You have dizziness.  You have mild pelvic cramps, pelvic pressure, or nagging  pain in your abdominal area.  You have persistent nausea, vomiting, or diarrhea.  You have a bad smelling vaginal discharge.  You have pain with urination. SEEK IMMEDIATE MEDICAL CARE IF:   You have a fever.  You are leaking fluid from your vagina.  You have spotting or bleeding from your vagina.  You have severe abdominal cramping or pain.  You have rapid weight loss or gain.  You have shortness of breath with chest pain.  You notice sudden or extreme swelling of your face, hands, ankles, feet, or legs.  You have not felt your baby move in over an hour.  You have severe headaches that do not go away with medicine.  You have vision changes. Document Released: 02/11/2001 Document Revised: 02/22/2013 Document Reviewed: 04/20/2012 ExitCare Patient Information 2015 [employer]. This information is not intended to replace advice given to you by your health care provider. Make sure you discuss any questions you have with your health care provider.   

## 2017-01-17 LAB — PMP SCREEN PROFILE (10S), URINE
Amphetamine Scrn, Ur: NEGATIVE ng/mL
BARBITURATE SCREEN URINE: NEGATIVE ng/mL
BENZODIAZEPINE SCREEN, URINE: NEGATIVE ng/mL
CANNABINOIDS UR QL SCN: POSITIVE ng/mL — AB
COCAINE(METAB.)SCREEN, URINE: NEGATIVE ng/mL
Creatinine(Crt), U: 46.7 mg/dL (ref 20.0–300.0)
METHADONE SCREEN, URINE: NEGATIVE ng/mL
OXYCODONE+OXYMORPHONE UR QL SCN: NEGATIVE ng/mL
Opiate Scrn, Ur: NEGATIVE ng/mL
PH UR, DRUG SCRN: 5.9 (ref 4.5–8.9)
PHENCYCLIDINE QUANTITATIVE URINE: NEGATIVE ng/mL
PROPOXYPHENE SCREEN URINE: NEGATIVE ng/mL

## 2017-01-17 LAB — URINE CULTURE

## 2017-01-29 ENCOUNTER — Encounter (HOSPITAL_COMMUNITY): Payer: Self-pay | Admitting: *Deleted

## 2017-01-29 ENCOUNTER — Inpatient Hospital Stay (HOSPITAL_COMMUNITY): Payer: BLUE CROSS/BLUE SHIELD | Admitting: Anesthesiology

## 2017-01-29 ENCOUNTER — Inpatient Hospital Stay (EMERGENCY_DEPARTMENT_HOSPITAL)
Admission: AD | Admit: 2017-01-29 | Discharge: 2017-01-29 | Disposition: A | Discharge status: Home / Self Care | Payer: BLUE CROSS/BLUE SHIELD | Source: Ambulatory Visit | Attending: Obstetrics & Gynecology | Admitting: Obstetrics & Gynecology

## 2017-01-29 ENCOUNTER — Inpatient Hospital Stay (HOSPITAL_COMMUNITY)
Admission: AD | Admit: 2017-01-29 | Discharge: 2017-02-01 | DRG: 806 | Disposition: A | Discharge status: Home / Self Care | Payer: BLUE CROSS/BLUE SHIELD | Source: Ambulatory Visit | Attending: Obstetrics and Gynecology | Admitting: Obstetrics and Gynecology

## 2017-01-29 DIAGNOSIS — O164 Unspecified maternal hypertension, complicating childbirth: Secondary | ICD-10-CM | POA: Diagnosis not present

## 2017-01-29 DIAGNOSIS — Z87891 Personal history of nicotine dependence: Secondary | ICD-10-CM

## 2017-01-29 DIAGNOSIS — R7989 Other specified abnormal findings of blood chemistry: Secondary | ICD-10-CM | POA: Diagnosis present

## 2017-01-29 DIAGNOSIS — O09299 Supervision of pregnancy with other poor reproductive or obstetric history, unspecified trimester: Secondary | ICD-10-CM

## 2017-01-29 DIAGNOSIS — Z3490 Encounter for supervision of normal pregnancy, unspecified, unspecified trimester: Secondary | ICD-10-CM

## 2017-01-29 DIAGNOSIS — O34219 Maternal care for unspecified type scar from previous cesarean delivery: Secondary | ICD-10-CM | POA: Diagnosis present

## 2017-01-29 DIAGNOSIS — O99324 Drug use complicating childbirth: Secondary | ICD-10-CM | POA: Diagnosis present

## 2017-01-29 DIAGNOSIS — O4703 False labor before 37 completed weeks of gestation, third trimester: Secondary | ICD-10-CM | POA: Diagnosis not present

## 2017-01-29 DIAGNOSIS — F1290 Cannabis use, unspecified, uncomplicated: Secondary | ICD-10-CM | POA: Diagnosis present

## 2017-01-29 DIAGNOSIS — Z3A34 34 weeks gestation of pregnancy: Secondary | ICD-10-CM

## 2017-01-29 DIAGNOSIS — Z23 Encounter for immunization: Secondary | ICD-10-CM | POA: Diagnosis not present

## 2017-01-29 LAB — URINALYSIS, ROUTINE W REFLEX MICROSCOPIC
BILIRUBIN URINE: NEGATIVE
Glucose, UA: NEGATIVE mg/dL
Hgb urine dipstick: NEGATIVE
Ketones, ur: NEGATIVE mg/dL
Nitrite: NEGATIVE
Protein, ur: 30 mg/dL — AB
SPECIFIC GRAVITY, URINE: 1.018 (ref 1.005–1.030)
pH: 6 (ref 5.0–8.0)

## 2017-01-29 LAB — CBC
HCT: 38 % (ref 36.0–46.0)
Hemoglobin: 12.3 g/dL (ref 12.0–15.0)
MCH: 29.5 pg (ref 26.0–34.0)
MCHC: 32.4 g/dL (ref 30.0–36.0)
MCV: 91.1 fL (ref 78.0–100.0)
PLATELETS: 272 10*3/uL (ref 150–400)
RBC: 4.17 MIL/uL (ref 3.87–5.11)
RDW: 12.5 % (ref 11.5–15.5)
WBC: 19.7 10*3/uL — AB (ref 4.0–10.5)

## 2017-01-29 LAB — TYPE AND SCREEN
ABO/RH(D): O POS
Antibody Screen: NEGATIVE

## 2017-01-29 MED ORDER — OXYTOCIN 40 UNITS IN LACTATED RINGERS INFUSION - SIMPLE MED
2.5000 [IU]/h | INTRAVENOUS | Status: DC
  Filled 2017-01-29: qty 1000

## 2017-01-29 MED ORDER — LACTATED RINGERS IV SOLN
INTRAVENOUS | Status: DC
  Administered 2017-01-30 (×3): via INTRAVENOUS

## 2017-01-29 MED ORDER — ACETAMINOPHEN 325 MG PO TABS: 650.0000 mg | ORAL_TABLET | ORAL | Status: DC | PRN

## 2017-01-29 MED ORDER — PHENYLEPHRINE 40 MCG/ML (10ML) SYRINGE FOR IV PUSH (FOR BLOOD PRESSURE SUPPORT)
80.0000 ug | PREFILLED_SYRINGE | INTRAVENOUS | Status: DC | PRN
  Filled 2017-01-29: qty 5
  Filled 2017-01-29: qty 10

## 2017-01-29 MED ORDER — ONDANSETRON HCL 4 MG/2ML IJ SOLN: 4.0000 mg | Freq: Four times a day (QID) | INTRAMUSCULAR | Status: DC | PRN

## 2017-01-29 MED ORDER — LIDOCAINE HCL (PF) 1 % IJ SOLN
INTRAMUSCULAR | Status: DC | PRN
  Administered 2017-01-29 (×2): 5 mL

## 2017-01-29 MED ORDER — EPHEDRINE 5 MG/ML INJ
10.0000 mg | INTRAVENOUS | Status: DC | PRN
  Filled 2017-01-29: qty 2

## 2017-01-29 MED ORDER — DIPHENHYDRAMINE HCL 50 MG/ML IJ SOLN: 12.5000 mg | INTRAMUSCULAR | Status: DC | PRN

## 2017-01-29 MED ORDER — PENICILLIN G POTASSIUM 5000000 UNITS IJ SOLR
5.0000 10*6.[IU] | Freq: Once | INTRAVENOUS | Status: AC
  Administered 2017-01-29: 5 10*6.[IU] via INTRAVENOUS
  Filled 2017-01-29: qty 5

## 2017-01-29 MED ORDER — LACTATED RINGERS IV SOLN
500.0000 mL | Freq: Once | INTRAVENOUS | Status: AC
  Administered 2017-01-30: 500 mL via INTRAVENOUS

## 2017-01-29 MED ORDER — PHENYLEPHRINE 40 MCG/ML (10ML) SYRINGE FOR IV PUSH (FOR BLOOD PRESSURE SUPPORT)
80.0000 ug | PREFILLED_SYRINGE | INTRAVENOUS | Status: DC | PRN
  Filled 2017-01-29: qty 5

## 2017-01-29 MED ORDER — PENICILLIN G POT IN DEXTROSE 60000 UNIT/ML IV SOLN
3.0000 10*6.[IU] | INTRAVENOUS | Status: DC
  Administered 2017-01-30 (×4): 3 10*6.[IU] via INTRAVENOUS
  Filled 2017-01-29 (×6): qty 50

## 2017-01-29 MED ORDER — OXYCODONE-ACETAMINOPHEN 5-325 MG PO TABS: 1.0000 | ORAL_TABLET | ORAL | Status: DC | PRN

## 2017-01-29 MED ORDER — LACTATED RINGERS IV SOLN: 500.0000 mL | INTRAVENOUS | Status: DC | PRN

## 2017-01-29 MED ORDER — BETAMETHASONE SOD PHOS & ACET 6 (3-3) MG/ML IJ SUSP
12.0000 mg | Freq: Once | INTRAMUSCULAR | Status: AC
  Administered 2017-01-29: 12 mg via INTRAMUSCULAR
  Filled 2017-01-29: qty 2

## 2017-01-29 MED ORDER — LIDOCAINE HCL (PF) 1 % IJ SOLN
30.0000 mL | INTRAMUSCULAR | Status: DC | PRN
  Filled 2017-01-29: qty 30

## 2017-01-29 MED ORDER — NIFEDIPINE 10 MG PO CAPS
10.0000 mg | ORAL_CAPSULE | ORAL | Status: AC | PRN
  Administered 2017-01-29 (×3): 10 mg via ORAL
  Filled 2017-01-29: qty 1

## 2017-01-29 MED ORDER — OXYCODONE-ACETAMINOPHEN 5-325 MG PO TABS: 2.0000 | ORAL_TABLET | ORAL | Status: DC | PRN

## 2017-01-29 MED ORDER — OXYTOCIN BOLUS FROM INFUSION
500.0000 mL | Freq: Once | INTRAVENOUS | Status: AC
  Administered 2017-01-30: 500 mL via INTRAVENOUS

## 2017-01-29 MED ORDER — FENTANYL CITRATE (PF) 100 MCG/2ML IJ SOLN
50.0000 ug | Freq: Once | INTRAMUSCULAR | Status: AC
  Administered 2017-01-29: 50 ug via INTRAVENOUS
  Filled 2017-01-29: qty 2

## 2017-01-29 MED ORDER — FENTANYL 2.5 MCG/ML BUPIVACAINE 1/10 % EPIDURAL INFUSION (WH - ANES)
14.0000 mL/h | INTRAMUSCULAR | Status: DC | PRN
  Administered 2017-01-29 – 2017-01-30 (×3): 14 mL/h via EPIDURAL
  Filled 2017-01-29 (×3): qty 100

## 2017-01-29 MED ORDER — SOD CITRATE-CITRIC ACID 500-334 MG/5ML PO SOLN: 30.0000 mL | ORAL | Status: DC | PRN

## 2017-01-29 MED ORDER — LACTATED RINGERS IV BOLUS (SEPSIS)
1000.0000 mL | Freq: Once | INTRAVENOUS | Status: AC
  Administered 2017-01-29: 1000 mL via INTRAVENOUS

## 2017-01-29 MED ORDER — NALBUPHINE HCL 10 MG/ML IJ SOLN: 5.0000 mg | INTRAMUSCULAR | Status: DC | PRN

## 2017-01-29 NOTE — Anesthesia Preprocedure Evaluation (Signed)
Anesthesia Evaluation  Patient identified by MRN, date of birth, ID band Patient awake    Reviewed: Allergy & Precautions, H&P , NPO status , Patient's Chart, lab work & pertinent test results  History of Anesthesia Complications Negative for: history of anesthetic complications  Airway Mallampati: II  TM Distance: >3 FB Neck ROM: full    Dental no notable dental hx. (+) Teeth Intact   Pulmonary neg pulmonary ROS, former smoker,    Pulmonary exam normal breath sounds clear to auscultation       Cardiovascular hypertension, negative cardio ROS Normal cardiovascular exam Rhythm:regular Rate:Normal     Neuro/Psych negative neurological ROS  negative psych ROS   GI/Hepatic negative GI ROS, Neg liver ROS,   Endo/Other  negative endocrine ROS  Renal/GU negative Renal ROS  negative genitourinary   Musculoskeletal   Abdominal   Peds  Hematology negative hematology ROS (+)   Anesthesia Other Findings   Reproductive/Obstetrics (+) Pregnancy                             Anesthesia Physical Anesthesia Plan  ASA: II  Anesthesia Plan: Epidural   Post-op Pain Management:    Induction:   PONV Risk Score and Plan:   Airway Management Planned:   Additional Equipment:   Intra-op Plan:   Post-operative Plan:   Informed Consent: I have reviewed the patients History and Physical, chart, labs and discussed the procedure including the risks, benefits and alternatives for the proposed anesthesia with the patient or authorized representative who has indicated his/her understanding and acceptance.     Plan Discussed with:   Anesthesia Plan Comments:         Anesthesia Quick Evaluation  

## 2017-01-29 NOTE — Discharge Instructions (Signed)

## 2017-01-29 NOTE — MAU Provider Note (Signed)
History     CSN: [id number]  Arrival date and time: 01/29/17 [id number]   First Provider Initiated Contact with Patient 01/29/17 1947     HPI Ms. [name] is a 36 y.o. G2P1001 at 6944w0d who presents to MAU today with complaint of contractions. She was seen this morning with the same complaint and did not make change after > 1 hour. She was offered Procardia, IV fluids and/or pain medication and refused earlier today. She states that since leaving here this morning the contractions are a little closer together and much more painful. She states contractions q 3-4 minutes. She has scant bleeding from her cervical exam earlier today. She reports fetal movement. She is planning repeat C/S.   OB History    Gravida Para Term Preterm AB Living   2 1 1     1    SAB TAB Ectopic Multiple Live Births           1      Past Medical History:  Diagnosis Date  . Pregnancy induced hypertension     Past Surgical History:  Procedure Laterality Date  . CESAREAN SECTION      Family History  Problem Relation Age of Onset  . Cancer Mother        breast  . Diabetes Father   . Cancer Maternal Grandmother     Social History   Tobacco Use  . Smoking status: Former Games developermoker  . Smokeless tobacco: Never Used  Substance Use Topics  . Alcohol use: No    Comment: none with  +UPT  . Drug use: No    Comment: last used April 2018    Allergies: No Known Allergies  Medications Prior to Admission  Medication Sig Dispense Refill Last Dose  . aspirin EC 81 MG tablet Take 81 mg by mouth daily.   01/28/2017 at Unknown time  . magnesium oxide (MAG-OX) 400 MG tablet Take 400 mg by mouth daily.   Past Week at Unknown time  . Prenatal Vit-Fe Fumarate-FA (PRENATAL MULTIVITAMIN) TABS tablet Take 1 tablet by mouth daily at 12 noon.   01/28/2017 at Unknown time    Review of Systems  Constitutional: Negative for fever.  Gastrointestinal: Positive for abdominal pain. Negative for constipation, diarrhea, nausea  and vomiting.  Genitourinary: Positive for vaginal bleeding. Negative for vaginal discharge.   Physical Exam   Blood pressure (!) 131/92, pulse 78, temperature (!) 97.4 F (36.3 C), temperature source Oral, resp. rate 20, last menstrual period 05/26/2016.  Physical Exam  Nursing note and vitals reviewed. Constitutional: She is oriented to person, place, and time. She appears well-developed and well-nourished. No distress.  HENT:  Head: Normocephalic and atraumatic.  Cardiovascular: Normal rate.  Respiratory: Effort normal.  GI: Soft. She exhibits no distension and no mass. There is no tenderness. There is no rebound and no guarding.  Neurological: She is alert and oriented to person, place, and time.  Skin: Skin is warm and dry. No erythema.  Psychiatric: She has a normal mood and affect.  Dilation: 2.5 Effacement (%): 80 Cervical Position: Middle Station: -2 Presentation: Vertex Exam by:: Kandra NicolasJulie P, np    Results for orders placed or performed during the hospital encounter of 01/29/17 (from the past 24 hour(s))  Urinalysis, Routine w reflex microscopic     Status: Abnormal   Collection Time: 01/29/17  8:54 AM  Result Value Ref Range   Color, Urine YELLOW YELLOW   APPearance HAZY (A) CLEAR   Specific Gravity,  Urine 1.018 1.005 - 1.030   pH 6.0 5.0 - 8.0   Glucose, UA NEGATIVE NEGATIVE mg/dL   Hgb urine dipstick NEGATIVE NEGATIVE   Bilirubin Urine NEGATIVE NEGATIVE   Ketones, ur NEGATIVE NEGATIVE mg/dL   Protein, ur 30 (A) NEGATIVE mg/dL   Nitrite NEGATIVE NEGATIVE   Leukocytes, UA MODERATE (A) NEGATIVE   RBC / HPF 0-5 0 - 5 RBC/hpf   WBC, UA 0-5 0 - 5 WBC/hpf   Bacteria, UA FEW (A) NONE SEEN   Squamous Epithelial / LPF 0-5 (A) NONE SEEN   Mucus PRESENT    Fetal Monitoring: Baseline: 125 bpm Variability: moderate Accelerations: 15 x 15 Decelerations: none Contractions: q 3-6 minutes   MAU Course  Procedures None  MDM UA today  Discussed patient with Dr.  Erin FullingHarraway-Smith given pain level and cervical change noted since this morning. She recommends IV LR bolus, Procardia and BMZ today.  Ordered as noted above.  2100 - Care turned over to [facility]anie Donn Zanetti, CNM   Vonzella NippleJulie Wenzel, PA-C 01/29/2017, 8:58 PM  Pt still uncomfortable after 2nd dose procardia, Fentanyl ordered. Procardia x3 doses given. Pt reports some initial relief but now feels ctx returning more frequently. SVE recheck 5/90/-2, vtx (confirmed by US). Pt desires TOLAC, labor team and attending MD notified.  Assessment and Plan  [redacted] weeks gestation Reactive NST Active labor TOLAC Admit to BS Management per labor team  Donette LarryBhambri, Kregg Cihlar, CNM  01/29/2017 10:27 PM

## 2017-01-29 NOTE — Anesthesia Procedure Notes (Signed)
Epidural Patient location during procedure: OB  Staffing Anesthesiologist: Seven Marengo, MD Performed: anesthesiologist   Preanesthetic Checklist Completed: patient identified, site marked, surgical consent, pre-op evaluation, timeout performed, IV checked, risks and benefits discussed and monitors and equipment checked  Epidural Patient position: sitting Prep: DuraPrep Patient monitoring: heart rate, continuous pulse ox and blood pressure Approach: right paramedian Location: L3-L4 Injection technique: LOR saline  Needle:  Needle type: Tuohy  Needle gauge: 17 G Needle length: 9 cm and 9 Needle insertion depth: 6 cm Catheter type: closed end flexible Catheter size: 20 Guage Catheter at skin depth: 10 cm Test dose: negative  Assessment Events: blood not aspirated, injection not painful, no injection resistance, negative IV test and no paresthesia  Additional Notes Patient identified. Risks/Benefits/Options discussed with patient including but not limited to bleeding, infection, nerve damage, paralysis, failed block, incomplete pain control, headache, blood pressure changes, nausea, vomiting, reactions to medication both or allergic, itching and postpartum back pain. Confirmed with bedside nurse the patient's most recent platelet count. Confirmed with patient that they are not currently taking any anticoagulation, have any bleeding history or any family history of bleeding disorders. Patient expressed understanding and wished to proceed. All questions were answered. Sterile technique was used throughout the entire procedure. Please see nursing notes for vital signs. Test dose was given through epidural needle and negative prior to continuing to dose epidural or start infusion. Warning signs of high block given to the patient including shortness of breath, tingling/numbness in hands, complete motor block, or any concerning symptoms with instructions to call for help. Patient was given  instructions on fall risk and not to get out of bed. All questions and concerns addressed with instructions to call with any issues.     

## 2017-01-29 NOTE — MAU Provider Note (Signed)
History     CSN: [id number]  Arrival date and time: 01/29/17 [id number]   First Provider Initiated Contact with Patient 01/29/17 0915      Chief Complaint  Patient presents with  . Contractions   HPI [name] is a 36 y.o. G2P1001 at 275w0d who presents with contractions. Reports contractions every 5 minutes since last night. Rates pain 6/10. Has not treated pain. States not all contractions are as painful. Denies n/v/d, constipation, dysuria, vaginal bleeding, or LOF. Positive fetal movement. No hx of PTL/PTD.   OB History    Gravida Para Term Preterm AB Living   2 1 1     1    SAB TAB Ectopic Multiple Live Births           1      Past Medical History:  Diagnosis Date  . Pregnancy induced hypertension     Past Surgical History:  Procedure Laterality Date  . CESAREAN SECTION      Family History  Problem Relation Age of Onset  . Cancer Mother        breast  . Diabetes Father   . Cancer Maternal Grandmother     Social History   Tobacco Use  . Smoking status: Former Games developermoker  . Smokeless tobacco: Never Used  Substance Use Topics  . Alcohol use: No    Comment: none with  +UPT  . Drug use: No    Comment: last used April 2018    Allergies: No Known Allergies  Medications Prior to Admission  Medication Sig Dispense Refill Last Dose  . aspirin EC 81 MG tablet Take 81 mg by mouth daily.   Taking  . magnesium oxide (MAG-OX) 400 MG tablet Take 400 mg by mouth daily.   Taking  . Prenatal MV-Min-Fe Fum-FA-DHA (PRENATAL 1 PO) Take by mouth daily.    Taking    Review of Systems  Constitutional: Negative.   Gastrointestinal: Positive for abdominal pain. Negative for constipation, diarrhea, nausea and vomiting.  Genitourinary: Negative.    Physical Exam   Blood pressure 124/69, pulse 68, temperature (!) 97.5 F (36.4 C), temperature source Oral, resp. rate 18, weight 212 lb 0.6 oz (96.2 kg), last menstrual period 05/26/2016, SpO2 100 %.  Physical Exam  Nursing  note and vitals reviewed. Constitutional: She is oriented to person, place, and time. She appears well-developed and well-nourished. No distress.  HENT:  Head: Normocephalic and atraumatic.  Eyes: Conjunctivae are normal. Right eye exhibits no discharge. Left eye exhibits no discharge. No scleral icterus.  Neck: Normal range of motion.  Respiratory: Effort normal. No respiratory distress.  GI: Soft. There is no tenderness.  Ctx palpate mild w/adequate resting tone  Genitourinary:  Genitourinary Comments: Dilation: 1.5 Effacement (%): 50 Cervical Position: Posterior Exam by:: Estanislado SpireE. Myishia Kasik, NP   Neurological: She is alert and oriented to person, place, and time.  Skin: Skin is warm and dry. She is not diaphoretic.  Psychiatric: She has a normal mood and affect. Her behavior is normal. Judgment and thought content normal.    MAU Course  Procedures Results for orders placed or performed during the hospital encounter of 01/29/17 (from the past 24 hour(s))  Urinalysis, Routine w reflex microscopic     Status: Abnormal   Collection Time: 01/29/17  8:54 AM  Result Value Ref Range   Color, Urine YELLOW YELLOW   APPearance HAZY (A) CLEAR   Specific Gravity, Urine 1.018 1.005 - 1.030   pH 6.0 5.0 - 8.0  Glucose, UA NEGATIVE NEGATIVE mg/dL   Hgb urine dipstick NEGATIVE NEGATIVE   Bilirubin Urine NEGATIVE NEGATIVE   Ketones, ur NEGATIVE NEGATIVE mg/dL   Protein, ur 30 (A) NEGATIVE mg/dL   Nitrite NEGATIVE NEGATIVE   Leukocytes, UA MODERATE (A) NEGATIVE   RBC / HPF 0-5 0 - 5 RBC/hpf   WBC, UA 0-5 0 - 5 WBC/hpf   Bacteria, UA FEW (A) NONE SEEN   Squamous Epithelial / LPF 0-5 (A) NONE SEEN   Mucus PRESENT     MDM NST:  Baseline: 135 bpm, Variability: Good {> 6 bpm), Accelerations: Reactive, Decelerations: Absent and initially 3-5, irregular crx Cervix 1.5/50/posterior; unchanged after 1+ hour of monitoring. Offered patient pain medication or dose of procardia for comfort -- declines.  Recommend IV fluids -- pt declines. Kept NPO as patient will be RCS if laboring.   Assessment and Plan  A: 1. Preterm uterine contractions in third trimester, antepartum   2. [redacted] weeks gestation of pregnancy    P: Discharge home Discussed reasons to return to MAU Increase water intake Keep ob appt tomorrow  [name] [name] 01/29/2017, 9:15 AM

## 2017-01-29 NOTE — MAU Note (Signed)
PT  SAYS    SHE WAS HERE  TODAY  - 2  CM.

## 2017-01-29 NOTE — MAU Note (Signed)
+  contraction Every 5 minutes Started last night and has gotten worse  Denies LOF or VB  +FM; states less than usual since contractions started but has felt some movement  Denies any problems with this pregnancy.

## 2017-01-30 ENCOUNTER — Encounter: Payer: BLUE CROSS/BLUE SHIELD | Admitting: Obstetrics & Gynecology

## 2017-01-30 ENCOUNTER — Encounter (HOSPITAL_COMMUNITY): Payer: Self-pay

## 2017-01-30 LAB — RPR: RPR Ser Ql: NONREACTIVE

## 2017-01-30 LAB — ABO/RH: ABO/RH(D): O POS

## 2017-01-30 MED ORDER — SIMETHICONE 80 MG PO CHEW: 80.0000 mg | CHEWABLE_TABLET | ORAL | Status: DC | PRN

## 2017-01-30 MED ORDER — SENNOSIDES-DOCUSATE SODIUM 8.6-50 MG PO TABS
2.0000 | ORAL_TABLET | ORAL | Status: DC
  Administered 2017-01-30 – 2017-01-31 (×2): 2 via ORAL
  Filled 2017-01-30 (×2): qty 2

## 2017-01-30 MED ORDER — COCONUT OIL OIL
1.0000 "application " | TOPICAL_OIL | Status: DC | PRN
  Filled 2017-01-30: qty 120

## 2017-01-30 MED ORDER — ACETAMINOPHEN 325 MG PO TABS: 650.0000 mg | ORAL_TABLET | ORAL | Status: DC | PRN

## 2017-01-30 MED ORDER — OXYTOCIN 40 UNITS IN LACTATED RINGERS INFUSION - SIMPLE MED
1.0000 m[IU]/min | INTRAVENOUS | Status: DC
  Administered 2017-01-30: 2 m[IU]/min via INTRAVENOUS

## 2017-01-30 MED ORDER — DIBUCAINE 1 % RE OINT: 1.0000 "application " | TOPICAL_OINTMENT | RECTAL | Status: DC | PRN

## 2017-01-30 MED ORDER — TETANUS-DIPHTH-ACELL PERTUSSIS 5-2.5-18.5 LF-MCG/0.5 IM SUSP
0.5000 mL | Freq: Once | INTRAMUSCULAR | Status: AC
  Administered 2017-01-31: 0.5 mL via INTRAMUSCULAR
  Filled 2017-01-30: qty 0.5

## 2017-01-30 MED ORDER — DIPHENHYDRAMINE HCL 25 MG PO CAPS: 25.0000 mg | ORAL_CAPSULE | Freq: Four times a day (QID) | ORAL | Status: DC | PRN

## 2017-01-30 MED ORDER — PRENATAL MULTIVITAMIN CH
1.0000 | ORAL_TABLET | Freq: Every day | ORAL | Status: DC
  Administered 2017-01-31 – 2017-02-01 (×2): 1 via ORAL
  Filled 2017-01-30 (×2): qty 1

## 2017-01-30 MED ORDER — BETAMETHASONE SOD PHOS & ACET 6 (3-3) MG/ML IJ SUSP
12.0000 mg | Freq: Once | INTRAMUSCULAR | Status: AC
  Administered 2017-01-30: 12 mg via INTRAMUSCULAR
  Filled 2017-01-30: qty 2

## 2017-01-30 MED ORDER — ONDANSETRON HCL 4 MG PO TABS: 4.0000 mg | ORAL_TABLET | ORAL | Status: DC | PRN

## 2017-01-30 MED ORDER — BENZOCAINE-MENTHOL 20-0.5 % EX AERO
1.0000 "application " | INHALATION_SPRAY | CUTANEOUS | Status: DC | PRN
  Administered 2017-01-31: 1 via TOPICAL
  Filled 2017-01-30 (×2): qty 56

## 2017-01-30 MED ORDER — LACTATED RINGERS IV SOLN
INTRAVENOUS | Status: DC
  Administered 2017-01-30: 16:00:00 via INTRAUTERINE

## 2017-01-30 MED ORDER — TERBUTALINE SULFATE 1 MG/ML IJ SOLN: 0.2500 mg | Freq: Once | INTRAMUSCULAR | Status: DC | PRN

## 2017-01-30 MED ORDER — WITCH HAZEL-GLYCERIN EX PADS: 1.0000 "application " | MEDICATED_PAD | CUTANEOUS | Status: DC | PRN

## 2017-01-30 MED ORDER — ONDANSETRON HCL 4 MG/2ML IJ SOLN
4.0000 mg | INTRAMUSCULAR | Status: DC | PRN
Start: 2017-01-30 — End: 2017-02-01

## 2017-01-30 MED ORDER — ZOLPIDEM TARTRATE 5 MG PO TABS: 5.0000 mg | ORAL_TABLET | Freq: Every evening | ORAL | Status: DC | PRN

## 2017-01-30 MED ORDER — IBUPROFEN 600 MG PO TABS
600.0000 mg | ORAL_TABLET | Freq: Four times a day (QID) | ORAL | Status: DC
  Administered 2017-01-30 – 2017-02-01 (×7): 600 mg via ORAL
  Filled 2017-01-30 (×7): qty 1

## 2017-01-30 NOTE — Anesthesia Pain Management Evaluation Note (Signed)
  CRNA Pain Management Visit Note  Patient: [name], 36 y.o., female  "Hello I am a [facility] of the anesthesia [facility]. We have an anesthesia team available at all times to provide care throughout the hospital, including epidural management and anesthesia for C-section. I don't know your plan for the delivery whether it a natural birth, water birth, IV sedation, nitrous supplementation, doula or epidural, but we want to meet your pain goals."   1.Was your pain managed to your expectations on prior hospitalizations? yes   2.What is your expectation for pain management during this hospitalization?     Epidural  3.How can we help you reach that goal? Epidural in place and is working well.  Record the patient's initial score and the patient's pain goal.   Pain: 0  Pain Goal: 4 [facility] wants you to be able to say your pain was always managed very well.  [name] 01/30/2017

## 2017-01-30 NOTE — Progress Notes (Signed)
   [name] is a 36 y.o. G2P1001 at 3240w1d  admitted for Preterm labor  Subjective:  comforatble with epidural  Objective: Vitals:   01/30/17 0005 01/30/17 0030 01/30/17 0100 01/30/17 0130  BP: 126/71 124/76 117/75 115/67  Pulse: 90 95 98 98  Resp:   16   Temp:   98.1 F (36.7 C)   TempSrc:   Oral   SpO2:      Weight:      Height:       No intake/output data recorded.  FHT:  FHR: 150 bpm, variability: moderate,  accelerations:  Present,  decelerations:  Absent UC:   irregular, every 4-6 minutes, s[aced out after epidural SVE:   Dilation: 6 Effacement (%): 90 Station: -1 Exam by:: Ileana LaddLexie Ament, RN  Labs: Lab Results  Component Value Date   WBC 19.7 (H) 01/29/2017   HGB 12.3 01/29/2017   HCT 38.0 01/29/2017   MCV 91.1 01/29/2017   PLT 272 01/29/2017    Assessment / Plan: Spontaneous labor, progressing normally  Labor: Progressing normally Fetal Wellbeing:  Category I Pain Control:  Epidural Anticipated MOD:  NSVD  CRESENZO-DISHMAN,[name] 01/30/2017, 1:38 AM

## 2017-01-30 NOTE — Progress Notes (Signed)
Vitals:   01/30/17 0530 01/30/17 0606  BP: 120/83   Pulse: 99   Resp:    Temp:  98.5 F (36.9 C)  SpO2:     Had a mild prolonged decel to 90's ,return to baseline after position change, IVF bolus. Moderate variability. Ctx seem to have spaced out considerably, q 10 minutes or so, pt can't feel them.  However,  Cx 7/90/-3.  Comfortable w/epidural. Expectant management.

## 2017-01-30 NOTE — Consult Note (Signed)
Asked by Dr.Arnold to provide prenatal consultation for patient with preterm labor at 34.[redacted] wks EGA.  Mother is 36 y.o. G2 P1, O pos  GBS unknown. Given betamethasone 11/29 and today 0932; multiple doses PCN for GBS prophylaxis. TOLAC.  Discussed with patient and FOB usual expectations for preterm infant at [redacted] weeks gestation, including possible needs for DR resuscitation, respiratory support, IV access. Also presented possible length of stay in NICU until 37 - [redacted] wks EGA.  Discussed advantages of feeding with mother's milk.  She plans to pump postnatally.  Patient was attentive, had appropriate questions, and was appreciative of my input.  Thank you for consulting Neonatology.  Total time 25 minutes, face-to-face time 15 minutes  JWimmer, MD

## 2017-01-30 NOTE — Progress Notes (Signed)
Labor Progress Note Lynnette Caffeymanda R Mazzuca is a 36 y.o. G2P1001 at 4797w1d presented for preterm labor. S: no complaints  O:  BP 127/75   Pulse 86   Temp 97.7 F (36.5 C) (Oral)   Resp 20   Ht 5\' 5"  (1.651 m)   Wt 212 lb (96.2 kg)   LMP 05/26/2016   SpO2 100%   BMI 35.28 kg/m  EFM: 130 bpm/mod var/no decels  CVE: Dilation: 7 Effacement (%): 90 Cervical Position: Middle Station: -2 Presentation: Vertex Exam by:: Tearra Ouk   A&P: 36 y.o. G2P1001 7697w1d here for preterm labor #Labor: AROM with clear fluid.  #FWB: now cat 1. EFM showed prolonged deceleration. Patient position changed and FSE placed after AROM.   Shamyra Farias, DO 1:19 PM

## 2017-01-30 NOTE — Progress Notes (Signed)
LABOR PROGRESS NOTE  [name] is a 36 y.o. G2P1001 at 9039w1d  admitted for SOL.  Subjective: Patient is doing well, family at bedside. Will receive second steroid dose. Patient would like to talk to NICU team prior to delivery.  Objective: BP 134/70   Pulse 87   Temp 98.5 F (36.9 C) (Oral)   Resp 20   Ht 5\' 5"  (1.651 m)   Wt 96.2 kg (212 lb)   LMP 05/26/2016   SpO2 100%   BMI 35.28 kg/m  or  Vitals:   01/30/17 0801 01/30/17 0831 01/30/17 0901 01/30/17 0931  BP: (!) 105/57 98/60 (!) 98/50 134/70  Pulse: 85 84 86 87  Resp:  20  20  Temp:      TempSrc:      SpO2:      Weight:      Height:       Last SVE: 0640 Dilation: 7 Effacement (%): 90 Cervical Position: Middle Station: -3 Presentation: Vertex Exam by:: Lexie Ament, Rn  FHT: HR 135, moderate variability, + accels, variable decel   Labs: Lab Results  Component Value Date   WBC 19.7 (H) 01/29/2017   HGB 12.3 01/29/2017   HCT 38.0 01/29/2017   MCV 91.1 01/29/2017   PLT 272 01/29/2017    Patient Active Problem List   Diagnosis Date Noted  . Preterm labor 01/29/2017  . Elevated serum creatinine 08/26/2016  . Marijuana use 08/26/2016  . Supervision of normal pregnancy 08/25/2016  . Pregnancy with history of cesarean section, antepartum 08/25/2016  . Hx of preeclampsia, prior pregnancy, currently pregnant 08/25/2016    Assessment / Plan: 36 y.o. G2P1001 at 7839w1d here for SOL.   Labor: Will receive second dose of BMZ and planning on AROM Fetal Wellbeing: Cat 2 Pain Control:  IV pain meds, epidural per patient. Anticipated MOD:  SVD  Lovena NeighboursAbdoulaye Arvell Pulsifer, MD 01/30/2017, 9:43 AM

## 2017-01-31 ENCOUNTER — Encounter (HOSPITAL_COMMUNITY)
Admission: AD | Disposition: A | Discharge status: Home / Self Care | Payer: Self-pay | Source: Ambulatory Visit | Attending: Obstetrics and Gynecology

## 2017-01-31 ENCOUNTER — Inpatient Hospital Stay (HOSPITAL_COMMUNITY): Payer: BLUE CROSS/BLUE SHIELD | Admitting: Certified Registered Nurse Anesthetist

## 2017-01-31 ENCOUNTER — Encounter (HOSPITAL_COMMUNITY): Payer: Self-pay

## 2017-01-31 ENCOUNTER — Other Ambulatory Visit: Payer: Self-pay

## 2017-01-31 HISTORY — PX: TUBAL LIGATION: SHX77

## 2017-01-31 LAB — CBC WITH DIFFERENTIAL/PLATELET
BASOS PCT: 0 %
Basophils Absolute: 0 10*3/uL (ref 0.0–0.1)
EOS ABS: 0 10*3/uL (ref 0.0–0.7)
EOS PCT: 0 %
HEMATOCRIT: 33.2 % — AB (ref 36.0–46.0)
HEMOGLOBIN: 10.7 g/dL — AB (ref 12.0–15.0)
Lymphocytes Relative: 8 %
Lymphs Abs: 1.6 10*3/uL (ref 0.7–4.0)
MCH: 29.9 pg (ref 26.0–34.0)
MCHC: 32.2 g/dL (ref 30.0–36.0)
MCV: 92.7 fL (ref 78.0–100.0)
MONO ABS: 1 10*3/uL (ref 0.1–1.0)
MONOS PCT: 5 %
Neutro Abs: 18.7 10*3/uL — ABNORMAL HIGH (ref 1.7–7.7)
Neutrophils Relative %: 87 %
Platelets: 278 10*3/uL (ref 150–400)
RBC: 3.58 MIL/uL — ABNORMAL LOW (ref 3.87–5.11)
RDW: 12.8 % (ref 11.5–15.5)
WBC: 21.3 10*3/uL — ABNORMAL HIGH (ref 4.0–10.5)

## 2017-01-31 SURGERY — LIGATION, FALLOPIAN TUBE, POSTPARTUM
Anesthesia: Choice

## 2017-01-31 MED ORDER — BUPIVACAINE HCL (PF) 0.5 % IJ SOLN
INTRAMUSCULAR | Status: AC
  Filled 2017-01-31: qty 30

## 2017-01-31 MED ORDER — LACTATED RINGERS IV SOLN: INTRAVENOUS | Status: DC

## 2017-01-31 NOTE — Anesthesia Postprocedure Evaluation (Signed)
Anesthesia Post Note  Patient: [name]  Procedure(s) Performed: AN AD HOC LABOR EPIDURAL     Patient location during evaluation: [facility] Anesthesia Type: Epidural Level of consciousness: awake and alert, oriented and patient cooperative Pain management: pain level controlled Vital Signs Assessment: post-procedure vital signs reviewed and stable Respiratory status: spontaneous breathing Cardiovascular status: stable Postop Assessment: no headache, epidural receding, patient able to bend at knees and no signs of nausea or vomiting Anesthetic complications: no Comments: Pain score 0.    Last Vitals:  Vitals:   01/31/17 0144 01/31/17 0742  BP: 120/72 127/69  Pulse: 69 72  Resp: 16 18  Temp: 36.5 C 36.9 C  SpO2: 98% 100%    Last Pain:  Vitals:   01/31/17 0742  TempSrc: Oral  PainSc:    Pain Goal:                 [facility],Nathanial Arrighi

## 2017-01-31 NOTE — Lactation Note (Addendum)
This note was copied from a baby's chart. Lactation Consultation Note  Patient Name: [name]'s Date: 01/31/2017 Reason for consult: Initial assessment;NICU baby;Late-preterm 34-36.6wks Infant is 2516 hours old and in NICU; mom seen by Lactation for Initial Assessment in her room. Baby was born at 2830w1d and weighed 6 lbs 3.8 oz at birth. Mom was about to pump when [facility] entered so reviewed how to clean parts as well as using Initiate phase. Mom reports she pumped last night but had not since then.  Provided NICU booklet and discussed importance of pumping at least 8-12x in 24hrs (q 2-3 hrs) for 15-20 mins followed by ~5 mins of hand expression. Discussed milk volume expectations, skin-to-skin & latching in NICU when able, engorgement prevention & treatment, and milk storage. Mom made aware of Outpatient services, Lactation number, and BF support group. Mom reports she does not have a pump at home but plans to call her insurance to get one. Encouraged mom to bring all pump parts home and bring them to NICU once home to pump with hospital DEBP as well.  Reviewed hand expression before mom pumped and a drop was noted on her left breast but none from her right. Explained again that this is normal and to do breast massage before & hand expression after pumping. Mom was pumping when LC left. Mom reports no questions at this time. Encouraged mom to ask for help as needed.  Maternal Data Has patient been taught Hand Expression?: Yes Does the patient have breastfeeding experience prior to this delivery?: No(has 35 year old Marissa who she did not BF with due to really high blood pressure)  Feeding   LATCH Score                   Interventions Interventions: Breast massage;Hand express; DEBP  Lactation Tools Discussed/Used     Consult Status Consult Status: Follow-up Date: 02/01/17 Follow-up type: In-patient    Oneal GroutLaura C Kwan Shellhammer 01/31/2017, 10:56 AM

## 2017-01-31 NOTE — Progress Notes (Signed)
Faculty Attending Note  Post Partum Day 1  Subjective: Patient is feeling well, some cramping. She reports moderately well controlled pain on PO pain meds. She is ambulating and denies light-headedness or dizziness. She is passing flatus. She is tolerating a regular diet without nausea/vomiting. Bleeding is moderate. She is breast & bottle feeding. Baby is in NICU for prematurity and doing well.  Objective: Blood pressure 127/69, pulse 72, temperature 98.4 F (36.9 C), temperature source Oral, resp. rate 18, height 5\' 5"  (1.651 m), weight 212 lb (96.2 kg), last menstrual period 05/26/2016, SpO2 100 %, unknown if currently breastfeeding. Temp:  [97.7 F (36.5 C)-98.5 F (36.9 C)] 98.4 F (36.9 C) (12/01 0742) Pulse Rate:  [62-86] 72 (12/01 0742) Resp:  [16-20] 18 (12/01 0742) BP: (111-140)/(57-82) 127/69 (12/01 0742) SpO2:  [98 %-100 %] 100 % (12/01 0742)  Physical Exam:  General: alert, oriented, cooperative Chest: CTAB, normal respiratory effort Heart: RRR  Abdomen: +BS, soft, appropriately tender to palpation  Uterine Fundus: unable to palpate secondary to habitus and sizefirm Lochia: moderate, rubra DVT Evaluation: no evidence of DVT Extremities: no edema, no calf tenderness   Current Facility-Administered Medications:  .  acetaminophen (TYLENOL) tablet 650 mg, 650 mg, Oral, Q4H PRN, Diallo, Abdoulaye, MD .  benzocaine-Menthol (DERMOPLAST) 20-0.5 % topical spray 1 application, 1 application, Topical, PRN, Diallo, Abdoulaye, MD .  coconut oil, 1 application, Topical, PRN, Diallo, Abdoulaye, MD .  witch hazel-glycerin (TUCKS) pad 1 application, 1 application, Topical, PRN **AND** dibucaine (NUPERCAINAL) 1 % rectal ointment 1 application, 1 application, Rectal, PRN, Diallo, Abdoulaye, MD .  diphenhydrAMINE (BENADRYL) capsule 25 mg, 25 mg, Oral, Q6H PRN, Diallo, Abdoulaye, MD .  ibuprofen (ADVIL,MOTRIN) tablet 600 mg, 600 mg, Oral, Q6H, Diallo, Abdoulaye, MD, 600 mg at 01/30/17  2341 .  ondansetron (ZOFRAN) tablet 4 mg, 4 mg, Oral, Q4H PRN **OR** ondansetron (ZOFRAN) injection 4 mg, 4 mg, Intravenous, Q4H PRN, Diallo, Abdoulaye, MD .  prenatal multivitamin tablet 1 tablet, 1 tablet, Oral, Q1200, Diallo, Abdoulaye, MD .  senna-docusate (Senokot-S) tablet 2 tablet, 2 tablet, Oral, Q24H, Diallo, Abdoulaye, MD, 2 tablet at 01/30/17 2341 .  simethicone (MYLICON) chewable tablet 80 mg, 80 mg, Oral, PRN, Diallo, Abdoulaye, MD .  Tdap (BOOSTRIX) injection 0.5 mL, 0.5 mL, Intramuscular, Once, Diallo, Abdoulaye, MD .  zolpidem (AMBIEN) tablet 5 mg, 5 mg, Oral, QHS PRN, Lovena Neighboursiallo, Abdoulaye, MD Recent Labs    01/29/17 2202 01/31/17 0949  HGB 12.3 10.7*  HCT 38.0 33.2*    Assessment/Plan:  Patient is 36 y.o. [id number] PPD#1 s/p VBAC at 5839w1d. She is doing well, recovering appropriately and complains only of minor pain. Patient desires BTL however, given that I am unable to palpate uterus on exam today, feel she is safer for interval tubal with laparoscopic BTL. Reviewed this procedure with patient and husband, they are in agreement with plan.   Continue routine post partum care Pain meds prn Regular diet Nothing currently for birth control Plan for discharge tomorrow   Conan BowensKelly M Antoney Biven 01/31/2017, 10:18 AM

## 2017-02-01 NOTE — Clinical Social Work Note (Signed)
Clinical Social Worker met with MOB briefly as she was being discharged off the unit.  Baby remains in the NICU but doing well.  Unable to complete full assessment, however MOB did state that she smoked marijuana about two months ago to help with constant feelings of nausea.  MOB tearful and remorseful about decision - expressed understanding for hospital policy drug screen.  MOB is anxious to spend time with baby in the NICU.  CSW to follow up with MOB for additional support while baby remains in the NICU.  Barbette Or, [place]

## 2017-02-01 NOTE — Progress Notes (Signed)
Faculty Attending Note  Post Partum Day 2  Subjective: Patient is feeling very well. She reports moderately well controlled pain on PO pain meds. She is ambulating and denies light-headedness or dizziness. She is  passing flatus. She is tolerating a regular diet without nausea/vomiting. Bleeding is moderate. She is breast & bottle feeding. Baby is in NICU and doing well.  Objective: Blood pressure 122/81, pulse 60, temperature 97.9 F (36.6 C), temperature source Oral, resp. rate 16, height 5\' 5"  (1.651 m), weight 212 lb (96.2 kg), last menstrual period 05/26/2016, SpO2 99 %, unknown if currently breastfeeding. Temp:  [97.9 F (36.6 C)-98.4 F (36.9 C)] 97.9 F (36.6 C) (12/02 0523) Pulse Rate:  [60-77] 60 (12/02 0523) Resp:  [16-18] 16 (12/02 0523) BP: (122-129)/(69-82) 122/81 (12/02 0523) SpO2:  [99 %-100 %] 99 % (12/02 0523)  Physical Exam:  General: alert, oriented, cooperative Chest: CTAB, normal respiratory effort Heart: RRR  Abdomen: +BS, soft, appropriately tender to palpation  Uterine Fundus: unable to palpate Lochia: moderate, rubra DVT Evaluation: no evidence of DVT Extremities: no edema, no calf tenderness   Current Facility-Administered Medications:  .  acetaminophen (TYLENOL) tablet 650 mg, 650 mg, Oral, Q4H PRN, Diallo, Abdoulaye, MD .  benzocaine-Menthol (DERMOPLAST) 20-0.5 % topical spray 1 application, 1 application, Topical, PRN, Diallo, Abdoulaye, MD, 1 application at 01/31/17 2302 .  coconut oil, 1 application, Topical, PRN, Diallo, Abdoulaye, MD .  witch hazel-glycerin (TUCKS) pad 1 application, 1 application, Topical, PRN **AND** dibucaine (NUPERCAINAL) 1 % rectal ointment 1 application, 1 application, Rectal, PRN, Diallo, Abdoulaye, MD .  diphenhydrAMINE (BENADRYL) capsule 25 mg, 25 mg, Oral, Q6H PRN, Diallo, Abdoulaye, MD .  ibuprofen (ADVIL,MOTRIN) tablet 600 mg, 600 mg, Oral, Q6H, Diallo, Abdoulaye, MD, 600 mg at 02/01/17 0526 .  ondansetron (ZOFRAN)  tablet 4 mg, 4 mg, Oral, Q4H PRN **OR** ondansetron (ZOFRAN) injection 4 mg, 4 mg, Intravenous, Q4H PRN, Diallo, Abdoulaye, MD .  prenatal multivitamin tablet 1 tablet, 1 tablet, Oral, Q1200, Diallo, Abdoulaye, MD, 1 tablet at 01/31/17 1121 .  senna-docusate (Senokot-S) tablet 2 tablet, 2 tablet, Oral, Q24H, Diallo, Abdoulaye, MD, 2 tablet at 01/31/17 2302 .  simethicone (MYLICON) chewable tablet 80 mg, 80 mg, Oral, PRN, Diallo, Abdoulaye, MD .  zolpidem (AMBIEN) tablet 5 mg, 5 mg, Oral, QHS PRN, Lovena Neighboursiallo, Abdoulaye, MD Recent Labs    01/29/17 2202 01/31/17 0949  HGB 12.3 10.7*  HCT 38.0 33.2*    Assessment/Plan:   Patient is 36 y.o. [id number] PPD#2 s/p VBAC at 5579w1d. She is doing very well, recovering appropriately. Mildly elevated BP this am, will recheck in several hours. She is for social work consult today for history of +THC in urine in pregnancy.   Continue routine post partum care Pain meds prn Regular diet Interval for birth control Monitor BP Plan for discharge today   Conan BowensKelly M Rhen Kawecki 02/01/2017, 6:11 AM

## 2017-02-01 NOTE — Discharge Summary (Signed)
   Physician Discharge Summary  Patient ID: [name] MRN: [id number] DOB/AGE: 35/10/1980 35 y.o.  Admit date: 01/29/2017 Discharge date: 02/02/2017   Discharge Diagnoses:  Principal Problem:   Preterm labor Active Problems:   Supervision of normal pregnancy   Pregnancy with history of cesarean section, antepartum   Hx of preeclampsia, prior pregnancy, currently pregnant   Elevated serum creatinine   SVD (spontaneous vaginal delivery)    Hospital Course: Please see HPI dated 01/29/2017 for details. This is a 36 y.o. [id number] now PPD#2 from a VBAC complicated by pre-term labor. Her antepartum course was complicated by pre-term labor. Her post partum course was uncomplicated. She had signed tubal papers but uterus was unable to be palpated post partum, she was counseled regarding interval tubal to which she is agreeable.  By day 2, she was ambulating, passing flatus and pain was well controlled. BP mildly elevated on HD#2, will recheck. Seen by SW for [facility]+THC in UDS in pregnancy. She was discharged home HD#2 in good condition. Instructions for follow up given.    Physical exam  Vitals:   02/01/17 0523 02/01/17 0800 02/01/17 1020 02/01/17 1200  BP: 122/81 (!) 145/80 129/68 137/67  Pulse: 60 73  65  Resp: 16 16  18   Temp: 97.9 F (36.6 C) (!) 97.3 F (36.3 C)  97.7 F (36.5 C)  TempSrc: Oral Oral  Oral  SpO2: 99% 100%  100%  Weight:      Height:       Please see progress note dated from today for physical exam Labs: Lab Results  Component Value Date   WBC 21.3 (H) 01/31/2017   HGB 10.7 (L) 01/31/2017   HCT 33.2 (L) 01/31/2017   MCV 92.7 01/31/2017   PLT 278 01/31/2017   CMP Latest Ref Rng & Units 09/24/2016  Glucose 65 - 99 mg/dL 83  BUN 6 - 20 mg/dL 8  Creatinine 1.470.57 - 8.291.00 mg/dL 5.62(Z0.50(L)  Sodium 308134 - 657144 mmol/L 135  Potassium 3.5 - 5.2 mmol/L 5.1  Chloride 96 - 106 mmol/L 100  CO2 20 - 29 mmol/L 21  Calcium 8.7 - 10.2 mg/dL 8.8  Total Protein 6.0 - 8.5 g/dL  6.2  Total Bilirubin 0.0 - 1.2 mg/dL <8.4<0.2  Alkaline Phos 39 - 117 IU/L 117  AST 0 - 40 IU/L 33  ALT 0 - 32 IU/L 45(H)     Consults: None  Postpartum contraception: Tubal Ligation   Disposition: 01-Home or Self Care  Discharged Condition: good  Discharge Instructions    Diet - low sodium heart healthy   Complete by:  As directed      Allergies as of 02/01/2017   No Known Allergies     Medication List    STOP taking these medications   aspirin EC 81 MG tablet   magnesium oxide 400 MG tablet Commonly known as:  MAG-OX   prenatal multivitamin Tabs tablet      Follow-up Information    [facility]. Schedule an appointment as soon as possible for a visit in 2 week(s).   Specialty:  Obstetrics and Gynecology Contact information: [address] [place] [id number] [phone]          Signed: Conan BowensKelly M Davis 02/02/2017, 8:36 AM

## 2017-02-01 NOTE — Discharge Instructions (Signed)
Postpartum Care After Vaginal Delivery °The period of time right after you deliver your newborn is called the postpartum period. °What kind of medical care will I receive? °· You may continue to receive fluids and medicines through an IV tube inserted into one of your veins. °· If an incision was made near your vagina (episiotomy) or if you had some vaginal tearing during delivery, cold compresses may be placed on your episiotomy or your tear. This helps to reduce pain and swelling. °· You may be given a squirt bottle to use when you go to the bathroom. You may use this until you are comfortable wiping as usual. To use the squirt bottle, follow these steps: °? Before you urinate, fill the squirt bottle with warm water. Do not use hot water. °? After you urinate, while you are sitting on the toilet, use the squirt bottle to rinse the area around your urethra and vaginal opening. This rinses away any urine and blood. °? You may do this instead of wiping. As you start healing, you may use the squirt bottle before wiping yourself. Make sure to wipe gently. °? Fill the squirt bottle with clean water every time you use the bathroom. °· You will be given sanitary pads to wear. °How can I expect to feel? °· You may not feel the need to urinate for several hours after delivery. °· You will have some soreness and pain in your abdomen and vagina. °· If you are breastfeeding, you may have uterine contractions every time you breastfeed for up to several weeks postpartum. Uterine contractions help your uterus return to its normal size. °· It is normal to have vaginal bleeding (lochia) after delivery. The amount and appearance of lochia is often similar to a menstrual period in the first week after delivery. It will gradually decrease over the next few weeks to a dry, yellow-brown discharge. For most women, lochia stops completely by 6-8 weeks after delivery. Vaginal bleeding can vary from woman to woman. °· Within the first few  days after delivery, you may have breast engorgement. This is when your breasts feel heavy, full, and uncomfortable. Your breasts may also throb and feel hard, tightly stretched, warm, and tender. After this occurs, you may have milk leaking from your breasts. Your health care provider can help you relieve discomfort due to breast engorgement. Breast engorgement should go away within a few days. °· You may feel more sad or worried than normal due to hormonal changes after delivery. These feelings should not last more than a few days. If these feelings do not go away after several days, speak with your health care provider. °How should I care for myself? °· Tell your health care provider if you have pain or discomfort. °· Drink enough water to keep your urine clear or pale yellow. °· Wash your hands thoroughly with soap and water for at least 20 seconds after changing your sanitary pads, after using the toilet, and before holding or feeding your baby. °· If you are not breastfeeding, avoid touching your breasts a lot. Doing this can make your breasts produce more milk. °· If you become weak or lightheaded, or you feel like you might faint, ask for help before: °? Getting out of bed. °? Showering. °· Change your sanitary pads frequently. Watch for any changes in your flow, such as a sudden increase in volume, a change in color, the passing of large blood clots. If you pass a blood clot from your vagina, save it   to show to your health care provider. Do not flush blood clots down the toilet without having your health care provider look at them. °· Make sure that all your vaccinations are up to date. This can help protect you and your baby from getting certain diseases. You may need to have immunizations done before you leave the hospital. °· If desired, talk with your health care provider about methods of family planning or birth control (contraception). °How can I start bonding with my baby? °Spending as much time as  possible with your baby is very important. During this time, you and your baby can get to know each other and develop a bond. Having your baby stay with you in your room (rooming in) can give you time to get to know your baby. Rooming in can also help you become comfortable caring for your baby. Breastfeeding can also help you bond with your baby. °How can I plan for returning home with my baby? °· Make sure that you have a car seat installed in your vehicle. °? Your car seat should be checked by a certified car seat installer to make sure that it is installed safely. °? Make sure that your baby fits into the car seat safely. °· Ask your health care provider any questions you have about caring for yourself or your baby. Make sure that you are able to contact your health care provider with any questions after leaving the hospital. °This information is not intended to replace advice given to you by your health care provider. Make sure you discuss any questions you have with your health care provider. °Document Released: 12/15/2006 Document Revised: 07/23/2015 Document Reviewed: 01/22/2015 °Elsevier Interactive Patient Education © 2018 [employer] ° °

## 2017-02-02 ENCOUNTER — Telehealth: Payer: Self-pay | Admitting: *Deleted

## 2017-02-02 ENCOUNTER — Ambulatory Visit: Payer: Self-pay

## 2017-02-02 NOTE — Lactation Note (Signed)
This note was copied from a baby's chart. Lactation Consultation Note: Mothers first time to offer breast to infant. Infant is 6869 hours old. Infant was placed skin to skin Attempt to latch infant to bare breast. Mothers breast are filling. She was taught to firm nipple.   Mother taught off sided latch technique. Infant on and off with short shallow suckles.  Mother was fit with a #16 nipple shield. Infant sustained latch for 25 mins. In cross cradle hold.  Infant was observed with frequent burst of suckling and audible swallows.   Lots of teaching with parents. Mothers breast softened after feeding. assit mother with latching infant on in football hold with good pillow support. Parents were taught to observed for swallows.  Mother advised to continue to pump every 2-3 hours for 15-20 mins.   [facility]   Patient Name: [name] VHQIO'NToday's Date: 02/02/2017 Reason for consult: Follow-up assessment   Maternal Data    Feeding Feeding Type: Breast Fed Length of feed: 5 min  LATCH Score Latch: Grasps breast easily, tongue down, lips flanged, rhythmical sucking.  Audible Swallowing: Spontaneous and intermittent  Type of Nipple: Everted at rest and after stimulation  Comfort (Breast/Nipple): Filling, red/small blisters or bruises, mild/mod discomfort  Hold (Positioning): Assistance needed to correctly position infant at breast and maintain latch.  LATCH Score: 8  Interventions Interventions: Assisted with latch;Skin to skin;Hand express;Pre-pump if needed;Breast compression;Adjust position;Support pillows;Position options;Expressed milk  Lactation Tools Discussed/Used Tools: Nipple Shields Nipple shield size: 16   Consult Status Consult Status: Follow-up Date: 02/03/17 Follow-up type: In-patient    [name], [name] 02/02/2017, 3:07 PM

## 2017-02-02 NOTE — Telephone Encounter (Signed)
Lmom for pt to call us back to schedule her postpartum appointment.  02-02-17  AS

## 2017-02-13 NOTE — H&P (Signed)
Obstetric History and Physical  [name] is a 36 y.o. [id number] with IUP at 418w1d presenting for preterm contractions.   Prenatal Course Source of Care: [facility] Pregnancy complications or risks: Patient Active Problem List   Diagnosis Date Noted  . SVD (spontaneous vaginal delivery) 01/30/2017  . Preterm labor 01/29/2017  . Elevated serum creatinine 08/26/2016  . Marijuana use 08/26/2016  . Supervision of normal pregnancy 08/25/2016  . Pregnancy with history of cesarean section, antepartum 08/25/2016  . Hx of preeclampsia, prior pregnancy, currently pregnant 08/25/2016   She plans to breastfeed She desires bilateral tubal ligation for postpartum contraception.   Clinic [facility]  Initiated Care at  11wks  FOB [name]  Dating By 7wk u/s  Pap 2016 neg  GC/CT Initial:  -/-              36+wks:  Genetic Screen NT/IT/AFP: declined  CF screen declined  Anatomic US Female, limited view spine, Repeat @ 22wks: normal  Flu vaccine 10/25  Tdap Recommended ~ 28wks  Glucose Screen  2 hr  81/166/87  GBS   Feed Preference breast  Contraception BTL, consent 01/15/17   Circumcision Yes, at hosp, will pay out of pocket  Childbirth Classes declined  Pediatrician [facility]   Prenatal Transfer Tool  Maternal Diabetes: No Genetic Screening: Declined Maternal Ultrasounds/Referrals: Normal Fetal Ultrasounds or other Referrals:  None Maternal Substance Abuse:  No Significant Maternal Medications:  None Significant Maternal Lab Results: None  Past Medical History:  Diagnosis Date  . Pregnancy induced hypertension     Past Surgical History:  Procedure Laterality Date  . CESAREAN SECTION    . TUBAL LIGATION N/A 01/31/2017   Procedure: POST PARTUM TUBAL LIGATION;  Surgeon: Conan Bowensavis, Kelly M, MD;  [facility];  Service: Gynecology;  Laterality: N/A;    OB History  Gravida Para Term Preterm AB Living  2 2 1 1   2   SAB TAB Ectopic Multiple Live Births         0 2    # Outcome Date GA Lbr Len/2nd Weight Sex Delivery Anes PTL Lv  2 Preterm 01/30/17 7718w1d 60:31 / 00:34 6 lb 3.8 oz (2.83 kg) M VBAC EPI  LIV  1 Term 12/03/06 3572w0d  6 lb 6 oz (2.892 kg) M CS-LTranv EPI  LIV     Complications: Failure to Progress in Second Stage     Birth Comments: c/s for failure to descend, pushed x 1.5hrs, pre-e      Social History   Socioeconomic History  . Marital status: Single    Spouse name: None  . Number of children: None  . Years of education: None  . Highest education level: None  Social Needs  . Financial resource strain: None  . Food insecurity - worry: None  . Food insecurity - inability: None  . Transportation needs - medical: None  . Transportation needs - non-medical: None  Occupational History  . None  Tobacco Use  . Smoking status: Former Games developermoker  . Smokeless tobacco: Never Used  Substance and Sexual Activity  . Alcohol use: No    Comment: none with  +UPT  . Drug use: No    Comment: last used April 2018  . Sexual activity: Yes    Birth control/protection: None  Other Topics Concern  . None  Social History Narrative  . None    Family History  Problem Relation Age of Onset  . Cancer Mother  breast  . Diabetes Father   . Cancer Maternal Grandmother     No medications prior to admission.    No Known Allergies  Review of Systems: Negative except for what is mentioned in HPI.  Physical Exam: BP 124/69 (BP Location: Right Arm)   Pulse 68   Temp (!) 97.5 F (36.4 C) (Oral)   Resp 18   Wt 212 lb 0.6 oz (96.2 kg)   LMP 05/26/2016   SpO2 100%   BMI 35.29 kg/m  CONSTITUTIONAL: Well-developed, well-nourished female in no acute distress.  HENT:  Normocephalic, atraumatic, External right and left ear normal. Oropharynx is clear and moist EYES: Conjunctivae and EOM are normal. Pupils are equal, round, and reactive to light. No scleral icterus.  NECK: Normal range of motion, supple, no masses SKIN: Skin is  warm and dry. No rash noted. Not diaphoretic. No erythema. No pallor. NEUROLOGIC: Alert and oriented to person, place, and time. Normal reflexes, muscle tone coordination. No cranial nerve deficit noted. PSYCHIATRIC: Normal mood and affect. Normal behavior. Normal judgment and thought content. CARDIOVASCULAR: Normal heart rate noted, regular rhythm RESPIRATORY: Effort and breath sounds normal, no problems with respiration noted ABDOMEN: Soft, nontender, nondistended, gravid. MUSCULOSKELETAL: Normal range of motion. No edema and no tenderness. 2+ distal pulses.    Pertinent Labs/Studies:   No results found for this or any previous visit (from the past 24 hour(s)).  Assessment : [name] is a 36 y.o. G2P1102 at 8043w1d being admitted for labor preterm labor. Plan: Labor: Expectant management. Analgesia as needed. FWB: Reassuring fetal heart tracing.  GBS unknown. Delivery plan: plan for TOLAC.  Rolm BookbinderAmber Jontavia Leatherbury, DO

## 2017-03-11 ENCOUNTER — Ambulatory Visit (INDEPENDENT_AMBULATORY_CARE_PROVIDER_SITE_OTHER): Payer: BLUE CROSS/BLUE SHIELD | Admitting: Advanced Practice Midwife

## 2017-03-11 ENCOUNTER — Encounter: Payer: Self-pay | Admitting: Advanced Practice Midwife

## 2017-03-11 NOTE — Progress Notes (Signed)
[name] is a 37 y.o. who presents for a postpartum visit. She is 36 weeks postpartum following a VBAC. I have fully reviewed the prenatal and intrapartum course. The delivery was at 34.1 gestational weeks.  Anesthesia: epidural. Postpartum course has been uneventful. She had planned in hospital BTL, but "uterus could not be palpated" so it wasn't done. Now husband is plannng vacectomy. Will use condoms in the mean time. Baby's course has been uneventful. Baby is feeding by both. Bleeding: no bleeding. Bowel function is normal. Bladder function is normal. Patient is not sexually active. Contraception method is abstinence. Postpartum depression screening: negative.   Current Outpatient Medications:  .  FENUGREEK PO, Take by mouth 2 (two) times daily., Disp: , Rfl:  .  Prenatal Vit-Fe Fumarate-FA (PRENATAL VITAMIN PO), Take by mouth daily., Disp: , Rfl:   Review of Systems   Constitutional: Negative for fever and chills Eyes: Negative for visual disturbances Respiratory: Negative for shortness of breath, dyspnea Cardiovascular: Negative for chest pain or palpitations  Gastrointestinal: Negative for vomiting, diarrhea and constipation Genitourinary: Negative for dysuria and urgency Musculoskeletal: Negative for back pain, joint pain, myalgias  Neurological: Negative for dizziness and headaches    Objective:     Vitals:   03/11/17 1012  BP: 112/60  Pulse: 60   General:  alert, cooperative and no distress   Breasts:  negative  Lungs: clear to auscultation bilaterally  Heart:  regular rate and rhythm  Abdomen: Soft, nontender   Vulva:  normal  Vagina: normal vagina  Cervix:  closed  Corpus: Well involuted     Rectal Exam: no hemorrhoids        Assessment:    normal postpartum exam.  Plan:   1. Contraception: condoms and vasectomy`  2. Follow up in:   or as needed.

## 2017-07-08 ENCOUNTER — Encounter (HOSPITAL_COMMUNITY): Payer: Self-pay

## 2017-07-08 ENCOUNTER — Ambulatory Visit (HOSPITAL_COMMUNITY)
Admission: EM | Admit: 2017-07-08 | Discharge: 2017-07-08 | Disposition: A | Discharge status: Home / Self Care | Payer: BLUE CROSS/BLUE SHIELD | Attending: Family Medicine | Admitting: Family Medicine

## 2017-07-08 DIAGNOSIS — K529 Noninfective gastroenteritis and colitis, unspecified: Secondary | ICD-10-CM | POA: Diagnosis not present

## 2017-07-08 LAB — POCT PREGNANCY, URINE: Preg Test, Ur: NEGATIVE

## 2017-07-08 LAB — POCT URINALYSIS DIP (DEVICE)
Bilirubin Urine: NEGATIVE
Glucose, UA: NEGATIVE mg/dL
HGB URINE DIPSTICK: NEGATIVE
Ketones, ur: 15 mg/dL — AB
LEUKOCYTES UA: NEGATIVE
NITRITE: NEGATIVE
PROTEIN: NEGATIVE mg/dL
SPECIFIC GRAVITY, URINE: 1.025 (ref 1.005–1.030)
UROBILINOGEN UA: 0.2 mg/dL (ref 0.0–1.0)
pH: 6 (ref 5.0–8.0)

## 2017-07-08 MED ORDER — ONDANSETRON HCL 4 MG PO TABS: 4.0000 mg | ORAL_TABLET | Freq: Three times a day (TID) | ORAL | 0 refills | Status: DC | PRN

## 2017-07-08 NOTE — Discharge Instructions (Signed)
Liquids only today. Advance to bland diet as tolerated. Zofran as needed for nausea.  Tylenol as needed for pain.  If develop increased pain, more localized to right lower quadrant of the abdomen, fevers, dehydration, or otherwise worsening please go to Er.  I would expect gradual improvement of symptoms in the next 36-48 hours.

## 2017-07-08 NOTE — ED Notes (Signed)
Patient is unable to void, provided a ginger ale

## 2017-07-08 NOTE — ED Triage Notes (Signed)
Pt presents with complaints of right sided abdominal pain and emesis x 1 day.

## 2017-07-08 NOTE — ED Provider Notes (Signed)
[facility]    CSN: [id number] Arrival date & time: 07/08/17  1313     History   Chief Complaint Chief Complaint  Patient presents with  . Abdominal Pain    HPI [name] is a 37 y.o. female.   [name] presents with complaints of abdominal pain, diarrhea and vomiting which she woke with this morning. Took a phenergan which she had left over at home, at approximately 1130, which has helped. No longer with nausea. Pain 6/10 and has improved. No exacerbating or relieving factors to pain. Decreased urination. Has not eaten or drank anything today. No known ill contacts. No specific known contaminated food intake. Has a 36 year old and 30 month old at home. She is not breastfeeding. Without blood to stool or emesis. Vomited approximately 10 times today and has had approximately 3-4 episodes of diarrhea. Without contributing medical history.      ROS per HPI.      Past Medical History:  Diagnosis Date  . Pregnancy induced hypertension     Patient Active Problem List   Diagnosis Date Noted  . SVD (spontaneous vaginal delivery) 01/30/2017  . Preterm labor 01/29/2017  . Elevated serum creatinine 08/26/2016  . Marijuana use 08/26/2016  . Supervision of normal pregnancy 08/25/2016  . Pregnancy with history of cesarean section, antepartum 08/25/2016  . Hx of preeclampsia, prior pregnancy, currently pregnant 08/25/2016    Past Surgical History:  Procedure Laterality Date  . CESAREAN SECTION    . TUBAL LIGATION N/A 01/31/2017   Procedure: POST PARTUM TUBAL LIGATION;  Surgeon: Conan Bowens, MD;  Location: [facility]TH[facility];  Service: Gynecology;  Laterality: N/A;    OB History    Gravida  2   Para  2   Term  1   Preterm  1   AB      Living  2     SAB      TAB      Ectopic      Multiple  0   Live Births  2            Home Medications    Prior to Admission medications   Medication Sig Start Date End Date Taking? Authorizing Provider   Prenatal Vit-Fe Fumarate-FA (PRENATAL VITAMIN PO) Take by mouth daily.   Yes [provider]  FENUGREEK PO Take by mouth 2 (two) times daily.    [provider]  ondansetron (ZOFRAN) 4 MG tablet Take 1 tablet (4 mg total) by mouth every 8 (eight) hours as needed for nausea or vomiting. 07/08/17   Georgetta Haber, NP    Family History Family History  Problem Relation Age of Onset  . Cancer Mother        breast  . Diabetes Father   . Cancer Maternal Grandmother     Social History Social History   Tobacco Use  . Smoking status: Former Smoker    Types: Cigarettes  . Smokeless tobacco: Never Used  Substance Use Topics  . Alcohol use: Yes    Comment: rare  . Drug use: No    Types: Marijuana    Comment: last used April 2018     Allergies   Patient has no known allergies.   Review of Systems Review of Systems   Physical Exam Triage Vital Signs ED Triage Vitals [07/08/17 1337]  Enc Vitals Group     BP 125/75     Pulse Rate 60     Resp  19     Temp 97.6 F (36.4 C)     Temp src      SpO2 100 %     Weight      Height      Head Circumference      Peak Flow      Pain Score 7     Pain Loc      Pain Edu?      Excl. in GC?    No data found.  Updated Vital Signs BP 125/75   Pulse 60   Temp 97.6 F (36.4 C)   Resp 19   SpO2 100%    Physical Exam  Constitutional: She is oriented to person, place, and time. She appears well-developed and well-nourished. No distress.  Cardiovascular: Normal rate, regular rhythm and normal heart sounds.  Pulmonary/Chest: Effort normal and breath sounds normal.  Abdominal: Soft. Bowel sounds are normal. There is no hepatosplenomegaly or splenomegaly. There is generalized tenderness and tenderness in the right upper quadrant, right lower quadrant, periumbilical area, suprapubic area and left lower quadrant. There is no rigidity, no rebound, no guarding, no CVA tenderness and negative Murphy's sign. No hernia.    Without pain with straight leg heel strike, no increased pain with laying flat on table; sipping on ginger ale   Neurological: She is alert and oriented to person, place, and time.  Skin: Skin is warm and dry.     UC Treatments / Results  Labs (all labs ordered are listed, but only abnormal results are displayed) Labs Reviewed - No data to display  EKG None  Radiology No results found.  Procedures Procedures (including critical care time)  Medications Ordered in UC Medications - No data to display  Initial Impression / Assessment and Plan / UC Course  I have reviewed the triage vital signs and the nursing notes.  Pertinent labs & imaging results that were available during my care of the patient were reviewed by me and considered in my medical decision making (see chart for details).     Afebrile. Without tachycardia. Non specific abdominal exam. Discussed appendicitis with patient, exam is not consistent with this at this time, but return precautions over the next 24 hours discussed at length. Liquid diet until symptoms improve, advance to bland diet as tolerated. Hand hygiene to prevent spread.    Final Clinical Impressions(s) / UC Diagnoses   Final diagnoses:  Gastroenteritis     Discharge Instructions     Liquids only today. Advance to bland diet as tolerated. Zofran as needed for nausea.  Tylenol as needed for pain.  If develop increased pain, more localized to right lower quadrant of the abdomen, fevers, dehydration, or otherwise worsening please go to Er.  I would expect gradual improvement of symptoms in the next 36-48 hours.      ED Prescriptions    Medication Sig Dispense Auth. Provider   ondansetron (ZOFRAN) 4 MG tablet Take 1 tablet (4 mg total) by mouth every 8 (eight) hours as needed for nausea or vomiting. 10 tablet Georgetta Haber, NP     Controlled Substance Prescriptions [place] Controlled Substance Registry consulted? Not Applicable   Georgetta Haber, NP 07/08/17 1440

## 2017-08-07 DIAGNOSIS — L02432 Carbuncle of left axilla: Secondary | ICD-10-CM | POA: Diagnosis not present

## 2018-04-06 ENCOUNTER — Ambulatory Visit: Payer: BLUE CROSS/BLUE SHIELD | Admitting: Adult Health

## 2018-05-05 ENCOUNTER — Other Ambulatory Visit (HOSPITAL_COMMUNITY)
Admission: RE | Admit: 2018-05-05 | Discharge: 2018-05-05 | Disposition: A | Discharge status: Home / Self Care | Payer: BLUE CROSS/BLUE SHIELD | Source: Ambulatory Visit | Attending: Adult Health | Admitting: Adult Health

## 2018-05-05 ENCOUNTER — Ambulatory Visit (INDEPENDENT_AMBULATORY_CARE_PROVIDER_SITE_OTHER): Payer: BLUE CROSS/BLUE SHIELD | Admitting: Adult Health

## 2018-05-05 ENCOUNTER — Encounter: Payer: Self-pay | Admitting: Adult Health

## 2018-05-05 VITALS — BP 117/81 | HR 63 | Ht 63.0 in | Wt 177.0 lb

## 2018-05-05 DIAGNOSIS — Z30011 Encounter for initial prescription of contraceptive pills: Secondary | ICD-10-CM

## 2018-05-05 DIAGNOSIS — Z01419 Encounter for gynecological examination (general) (routine) without abnormal findings: Secondary | ICD-10-CM | POA: Insufficient documentation

## 2018-05-05 MED ORDER — NORETHIN-ETH ESTRAD-FE BIPHAS 1 MG-10 MCG / 10 MCG PO TABS: 1.0000 | ORAL_TABLET | Freq: Every day | ORAL | 0 refills | Status: DC

## 2018-05-05 NOTE — Progress Notes (Signed)
Patient ID: [name], female   DOB: 10-19-1980, 38 y.o.   MRN: [id number] History of Present Illness: [name] is a 38 year old white female, married G2P2 in for well woman gyn exam and pap. PCP is Dr Maurice Small at [place].    Current Medications, Allergies, Past Medical History, Past Surgical History, Family History and Social History were reviewed in [employer] record.     Review of Systems: Patient denies any headaches, hearing loss, fatigue, blurred vision, shortness of breath, chest pain, abdominal pain, problems with bowel movements, urination, or intercourse. No joint pain or mood swings. Has been using condoms.She was supposed to get tubal at time of  Csection but had vaginal delivery and did not get.   Physical Exam:BP 117/81 (BP Location: Left Arm, Patient Position: Sitting, Cuff Size: Normal)   Pulse 63   Ht 5\' 3"  (1.6 m)   Wt 177 lb (80.3 kg)   LMP 04/28/2018 (Approximate)   Breastfeeding No   BMI 31.35 kg/m  General:  Well developed, well nourished, no acute distress Skin:  Warm and dry Neck:  Midline trachea, normal thyroid, good ROM, no lymphadenopathy Lungs; Clear to auscultation bilaterally Breast:  No dominant palpable mass, retraction, or nipple discharge Cardiovascular: Regular rate and rhythm Abdomen:  Soft, non tender, no hepatosplenomegaly Pelvic:  External genitalia is normal in appearance, no lesions.  The vagina is normal in appearance. Urethra has no lesions or masses. The cervix is bulbous. Pap with HPV performed.  Uterus is felt to be normal size, shape, and contour.  No adnexal masses or tenderness noted.Bladder is non tender, no masses felt. Extremities/musculoskeletal:  No swelling or varicosities noted, no clubbing or cyanosis Psych:  No mood changes, alert and cooperative,seems happy Fall risk is low. PHQ 2 score 0. Examination chaperoned by Malachy Mood LPN. Will try lo loestrin to see if she does well, others have made  her sick in the past.   Impression: 1. Encounter for gynecological examination with Papanicolaou smear of cervix   2. Encounter for initial prescription of contraceptive pills      Plan: Meds ordered this encounter  Medications  . Norethindrone-Ethinyl Estradiol-Fe Biphas (LO LOESTRIN FE) 1 MG-10 MCG / 10 MCG tablet    Sig: Take 1 tablet by mouth daily. Take 1 daily by mouth    Dispense:  3 Package    Refill:  0    BIN [id number], PCN [id number], GRP [id number]    Order Specific Question:   Supervising Provider    Answer:   Lazaro Arms [[id number]]  Start lo loestrin today and use condoms F/U in 2 months Physical in 1 year Pap in 3 if normal Mammogram at 41

## 2018-05-07 LAB — CYTOLOGY - PAP
Diagnosis: NEGATIVE
HPV: NOT DETECTED

## 2019-08-17 ENCOUNTER — Other Ambulatory Visit: Payer: Self-pay | Admitting: Physician Assistant

## 2019-08-17 DIAGNOSIS — N6320 Unspecified lump in the left breast, unspecified quadrant: Secondary | ICD-10-CM

## 2019-08-26 ENCOUNTER — Other Ambulatory Visit: Payer: Self-pay

## 2019-08-26 ENCOUNTER — Ambulatory Visit
Admission: RE | Admit: 2019-08-26 | Discharge: 2019-08-26 | Disposition: A | Discharge status: Home / Self Care | Payer: BLUE CROSS/BLUE SHIELD | Source: Ambulatory Visit | Attending: Physician Assistant | Admitting: Physician Assistant

## 2019-08-26 ENCOUNTER — Ambulatory Visit
Admission: RE | Admit: 2019-08-26 | Discharge: 2019-08-26 | Disposition: A | Discharge status: Home / Self Care | Payer: 59 | Source: Ambulatory Visit | Attending: Physician Assistant | Admitting: Physician Assistant

## 2019-08-26 DIAGNOSIS — N6320 Unspecified lump in the left breast, unspecified quadrant: Secondary | ICD-10-CM

## 2020-10-02 ENCOUNTER — Other Ambulatory Visit: Payer: 59 | Admitting: Adult Health

## 2020-10-05 IMAGING — US US BREAST*L* LIMITED INC AXILLA
1 series · 2 of 2 positions shown · non-contrast
Comparison: Previous exam(s).

CLINICAL DATA: Focal lump in the left breast. The patient's mother
was diagnosed with breast cancer at the age of 60.

EXAM:
DIGITAL DIAGNOSTIC BILATERAL MAMMOGRAM WITH CAD AND TOMO
ULTRASOUND LEFT BREAST

[Series 1: us breast*left* limited inc axilla · 0.06mm/px · 2 of 2 slices shown]
[im 1/2]
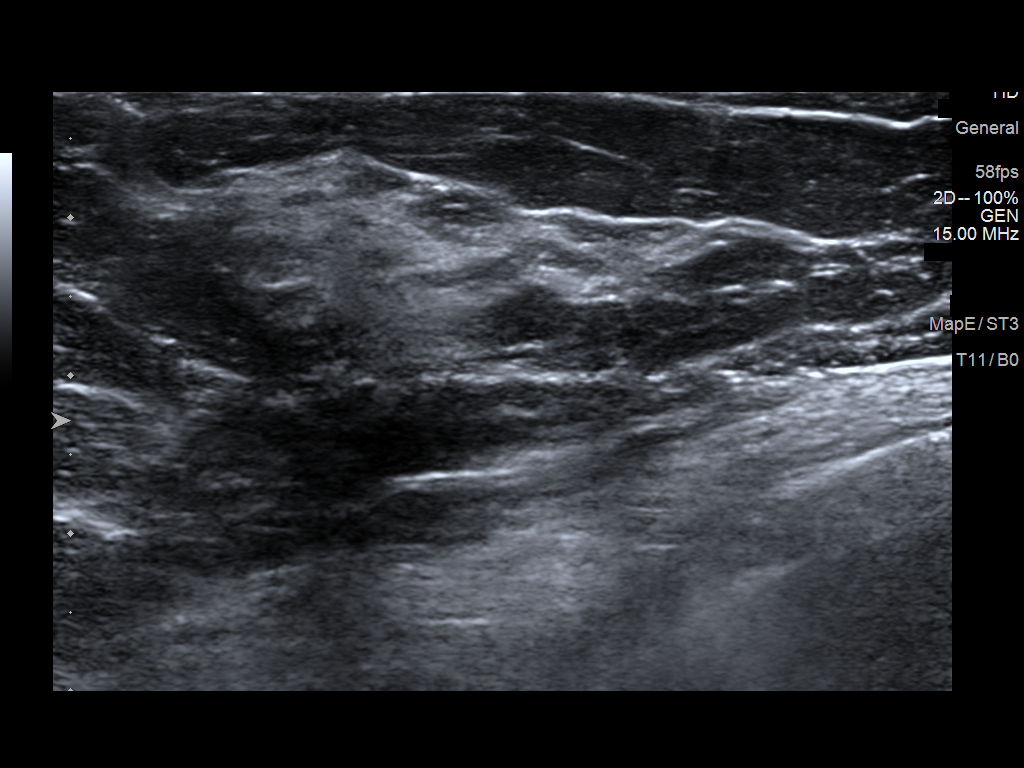
[im 2/2]
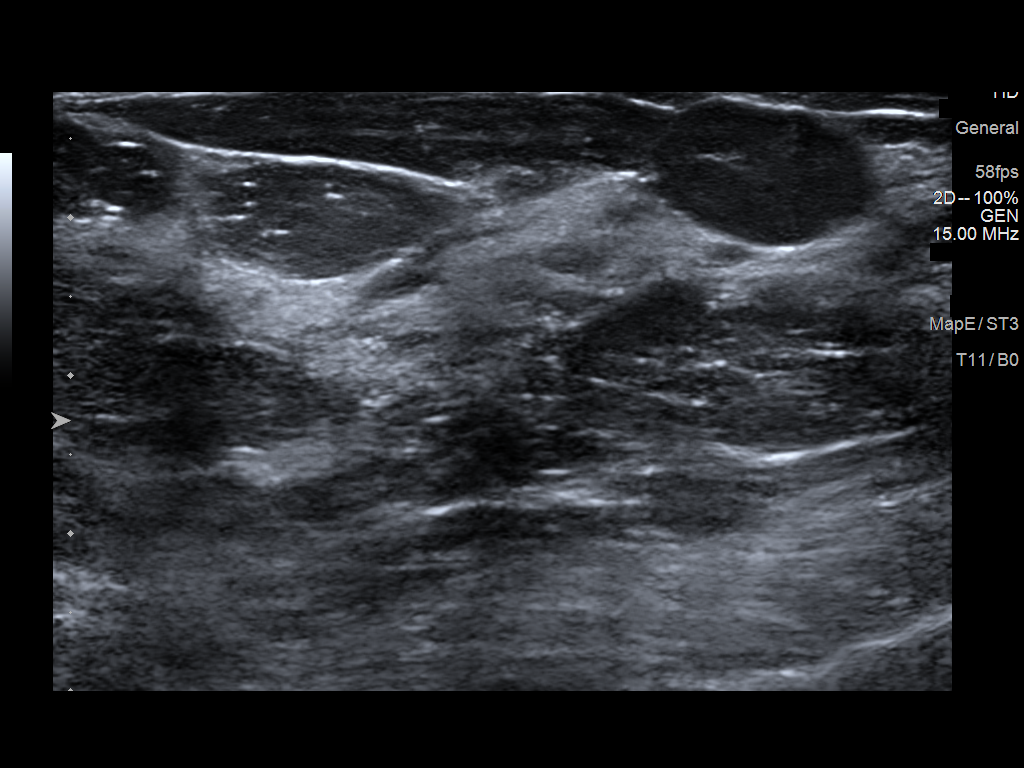

[2 of 2 positions shown; findings below may reference images not displayed]

ACR Breast Density Category c: The breast tissue is heterogeneously
dense, which may obscure small masses.
FINDINGS: No suspicious masses, calcifications, or distortion are seen in
either breast.

Mammographic images were processed with CAD.

On physical exam, no suspicious lumps are identified.

Targeted ultrasound is performed, showing no sonographic
abnormalities. The patient appears to be feeling an island of dense
glandular tissue.
IMPRESSION: No mammographic or sonographic evidence of malignancy.

RECOMMENDATION:
Annual screening mammography beginning at the age of 40.

I have discussed the findings and recommendations with the patient.
If applicable, a reminder letter will be sent to the patient
regarding the next appointment.

BI-RADS CATEGORY  2: Benign.

## 2020-10-05 IMAGING — MG DIGITAL DIAGNOSTIC BILAT W/ TOMO W/ CAD
6 of 10 series · 6 of 30 positions shown · non-contrast
Comparison: Previous exam(s).

CLINICAL DATA: Focal lump in the left breast. The patient's mother
was diagnosed with breast cancer at the age of 60.

EXAM:
DIGITAL DIAGNOSTIC BILATERAL MAMMOGRAM WITH CAD AND TOMO
ULTRASOUND LEFT BREAST

[L MLO synth-2D]
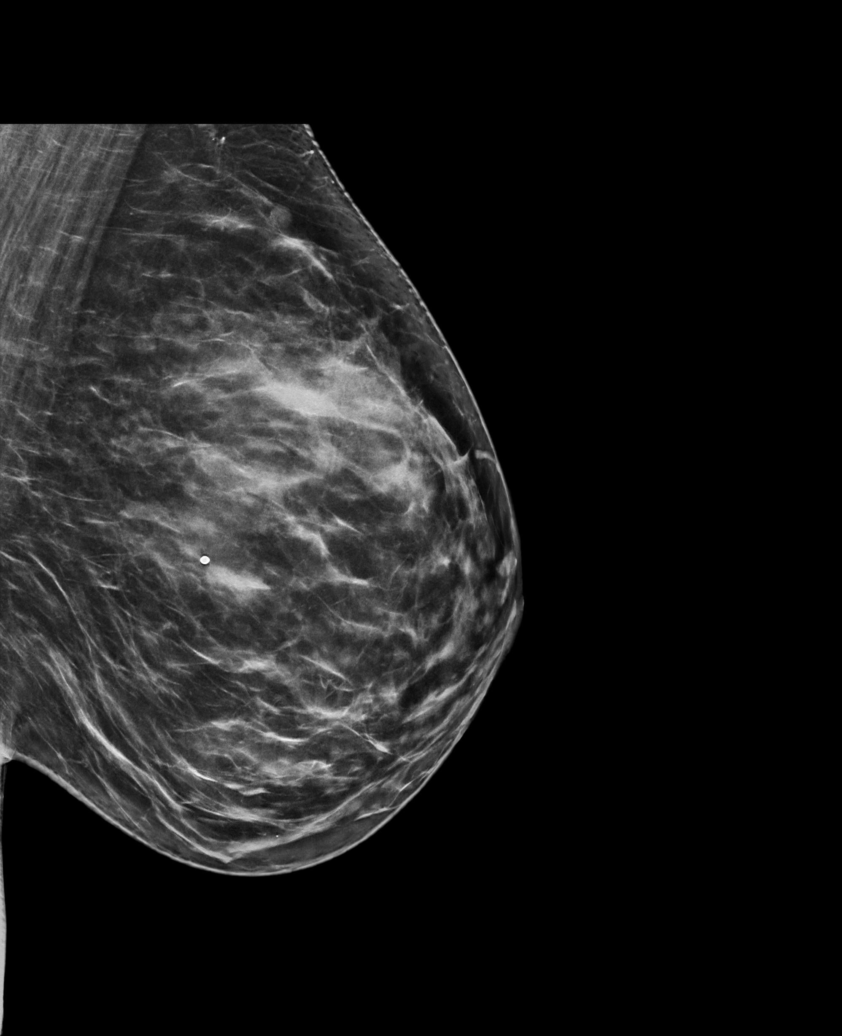

[L CC synth-2D]
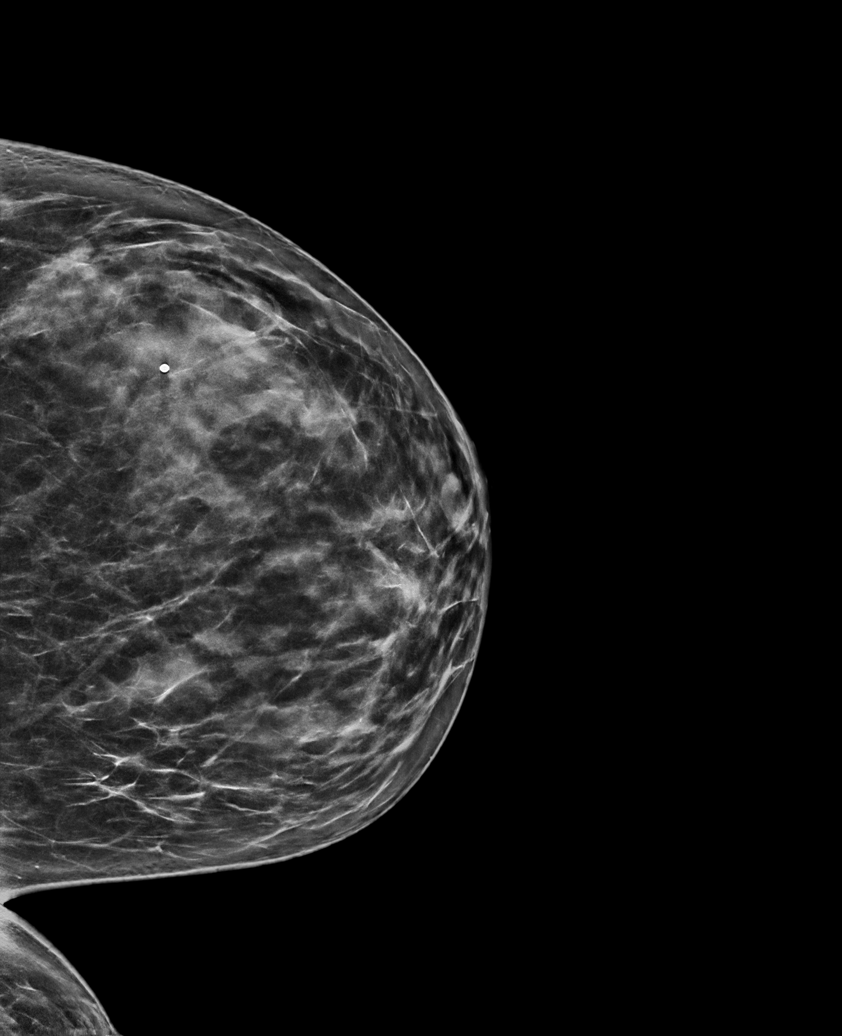

[R CC synth-2D]
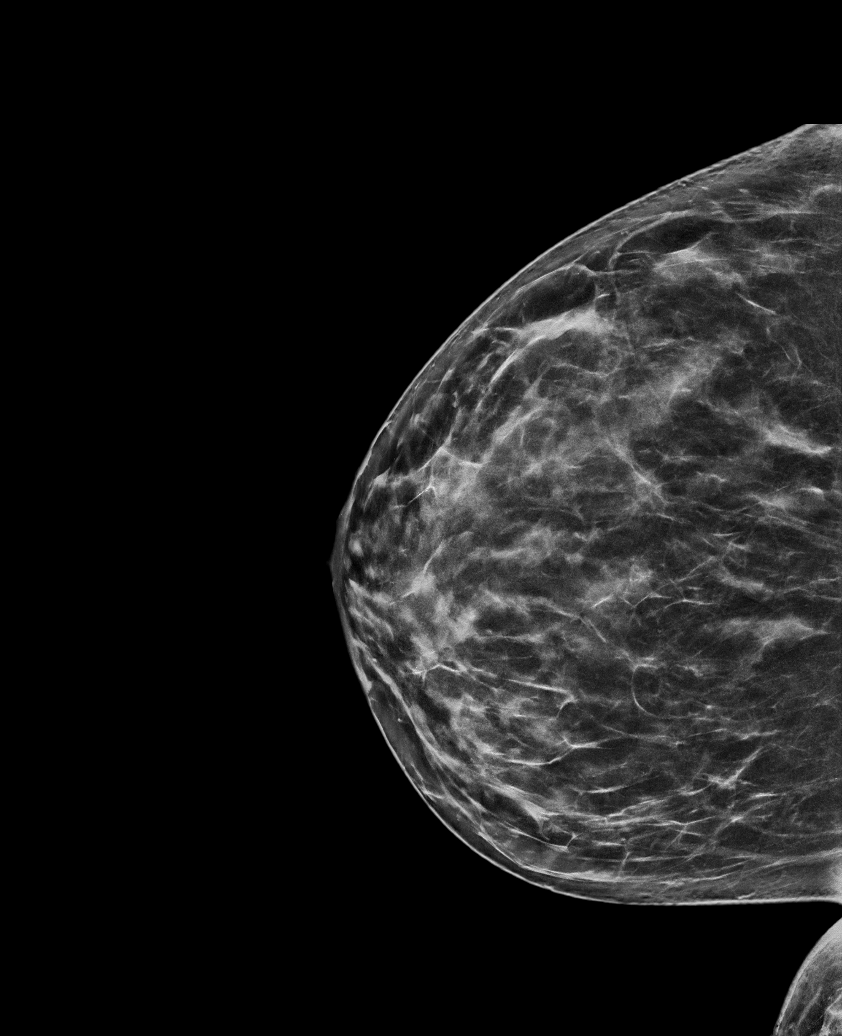

[R MLO synth-2D]
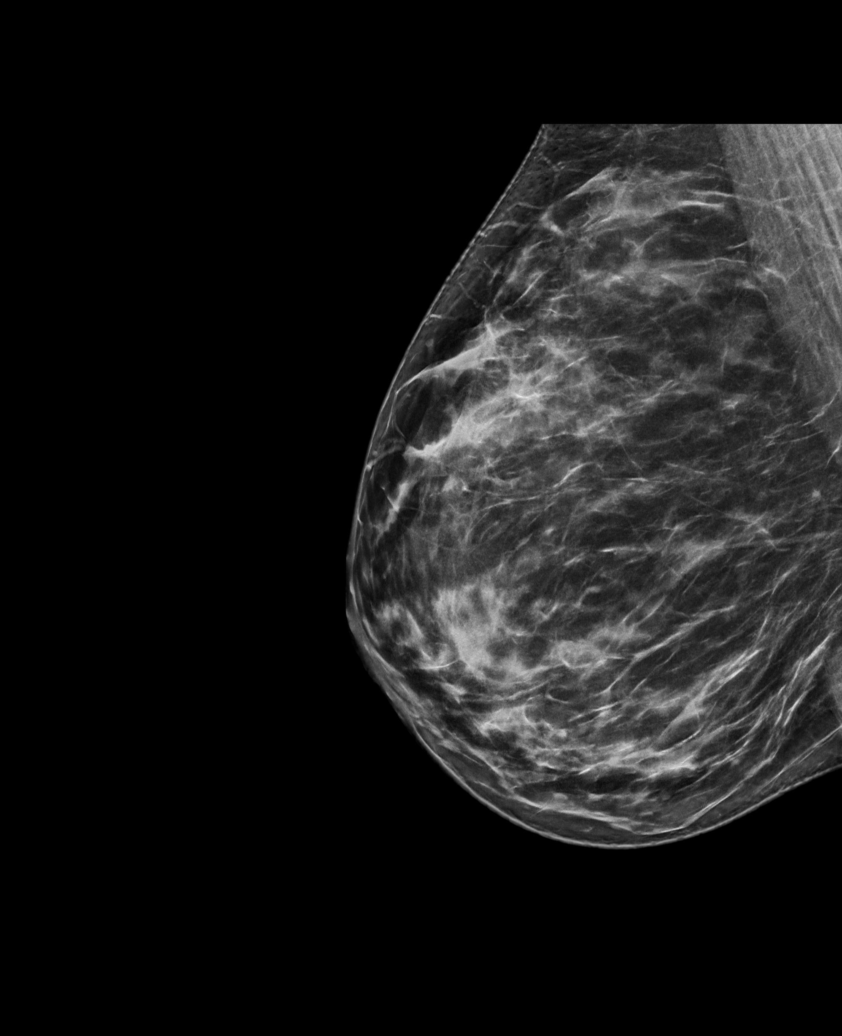

[L TAN synth-2D]
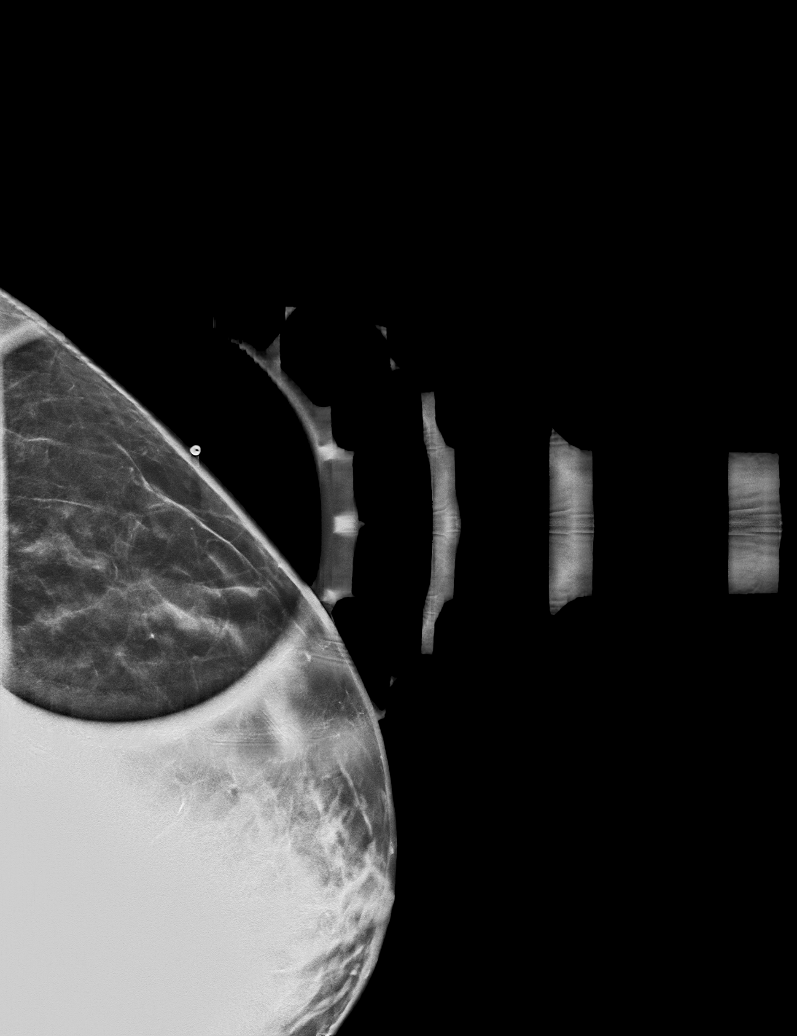

[R MLO tomo · tomo slice 41/80.0]
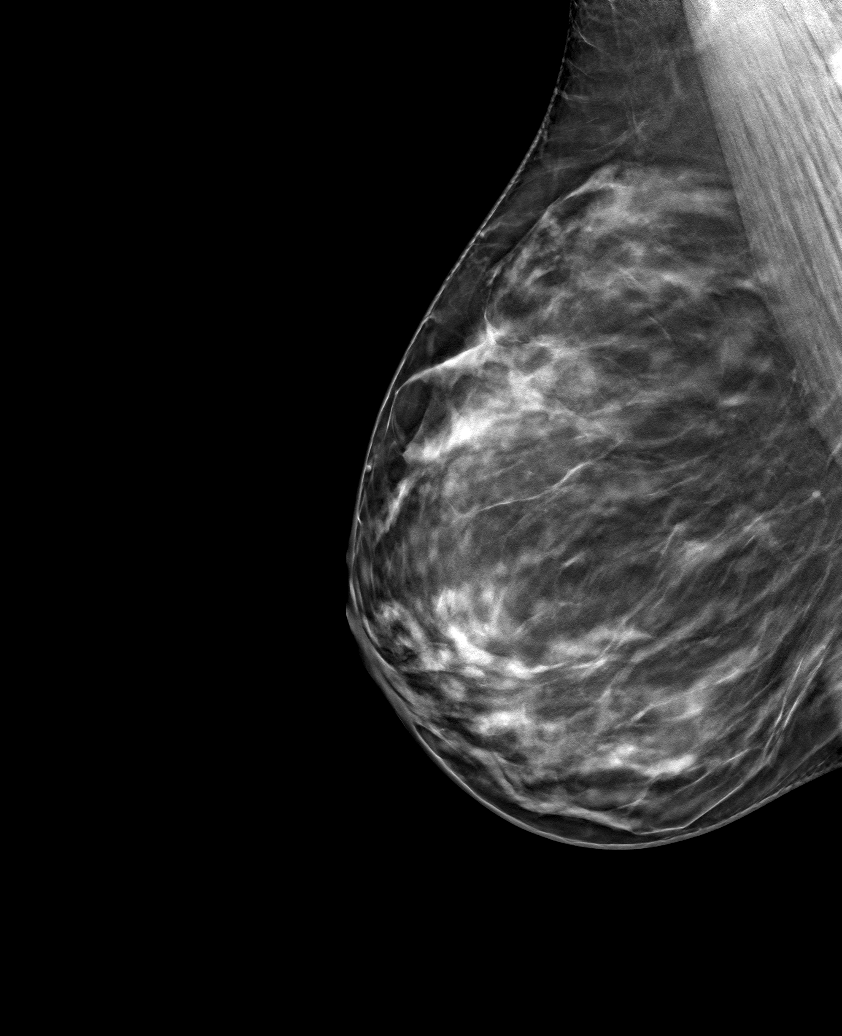

[6 of 30 positions shown; findings below may reference images not displayed]

ACR Breast Density Category c: The breast tissue is heterogeneously
dense, which may obscure small masses.
FINDINGS: No suspicious masses, calcifications, or distortion are seen in
either breast.

Mammographic images were processed with CAD.

On physical exam, no suspicious lumps are identified.

Targeted ultrasound is performed, showing no sonographic
abnormalities. The patient appears to be feeling an island of dense
glandular tissue.
IMPRESSION: No mammographic or sonographic evidence of malignancy.

RECOMMENDATION:
Annual screening mammography beginning at the age of 40.

I have discussed the findings and recommendations with the patient.
If applicable, a reminder letter will be sent to the patient
regarding the next appointment.

BI-RADS CATEGORY  2: Benign.

## 2021-01-29 ENCOUNTER — Ambulatory Visit (INDEPENDENT_AMBULATORY_CARE_PROVIDER_SITE_OTHER): Payer: 59 | Admitting: Obstetrics & Gynecology

## 2021-01-29 ENCOUNTER — Other Ambulatory Visit: Payer: Self-pay

## 2021-01-29 ENCOUNTER — Encounter: Payer: Self-pay | Admitting: Obstetrics & Gynecology

## 2021-01-29 ENCOUNTER — Other Ambulatory Visit (HOSPITAL_COMMUNITY)
Admission: RE | Admit: 2021-01-29 | Discharge: 2021-01-29 | Disposition: A | Discharge status: Home / Self Care | Payer: 59 | Source: Ambulatory Visit | Attending: Obstetrics & Gynecology | Admitting: Obstetrics & Gynecology

## 2021-01-29 VITALS — BP 123/80 | HR 69 | Ht 65.0 in | Wt 176.0 lb

## 2021-01-29 DIAGNOSIS — Z1231 Encounter for screening mammogram for malignant neoplasm of breast: Secondary | ICD-10-CM | POA: Diagnosis not present

## 2021-01-29 DIAGNOSIS — Z01419 Encounter for gynecological examination (general) (routine) without abnormal findings: Secondary | ICD-10-CM | POA: Insufficient documentation

## 2021-01-29 NOTE — Progress Notes (Signed)
GYNECOLOGY ANNUAL PREVENTATIVE CARE ENCOUNTER NOTE  History:     [name] is a 40 y.o. G57P1102 female here for a routine annual gynecologic exam.  Current complaints: none.   Denies abnormal vaginal bleeding, discharge, pelvic pain, problems with intercourse or other gynecologic concerns.    Gynecologic History Patient's last menstrual period was 01/25/2021. Contraception: condoms Last Pap: 05/05/2018. Result was normal with negative HPV Last Mammogram: 08/26/2019.  Result was normal   Obstetric History OB History  Gravida Para Term Preterm AB Living  2 2 1 1   2   SAB IAB Ectopic Multiple Live Births        0 2    # Outcome Date GA Lbr Len/2nd Weight Sex Delivery Anes PTL Lv  2 Preterm 01/30/17 [redacted]w[redacted]d 60:31 / 00:34 6 lb 3.8 oz (2.83 kg) M VBAC EPI  LIV  1 Term 12/03/06 [redacted]w[redacted]d  6 lb 6 oz (2.892 kg) M CS-LTranv EPI  LIV     Birth Comments: c/s for failure to descend, pushed x 1.5hrs, pre-e     Complications: Failure to Progress in Second Stage    Past Medical History:  Diagnosis Date   Hypertension    pregnancy induced    Past Surgical History:  Procedure Laterality Date   CESAREAN SECTION  2008   TUBAL LIGATION N/A 01/31/2017   pt did not have pp tubal     Current Outpatient Medications on File Prior to Visit  Medication Sig Dispense Refill   Multiple Vitamin (MULTI-VITAMIN DAILY PO) Multi Vitamin     Norethindrone-Ethinyl Estradiol-Fe Biphas (LO LOESTRIN FE) 1 MG-10 MCG / 10 MCG tablet Take 1 tablet by mouth daily. Take 1 daily by mouth (Patient not taking: Reported on 01/29/2021) 3 Package 0   No current facility-administered medications on file prior to visit.    No Known Allergies  Social History:  reports that she has quit smoking. Her smoking use included cigarettes. She has never used smokeless tobacco. She reports current alcohol use. She reports that she does not use drugs.  Family History  Problem Relation Age of Onset   Breast cancer Mother 37    Diabetes Father    Cancer Maternal Grandmother        vulva    The following portions of the patient's history were reviewed and updated as appropriate: allergies, current medications, past family history, past medical history, past social history, past surgical history and problem list.  Review of Systems Pertinent items noted in HPI and remainder of comprehensive ROS otherwise negative.  Physical Exam:  BP 123/80   Pulse 69   Ht 5\' 5"  (1.651 m)   Wt 176 lb (79.8 kg)   LMP 01/25/2021   BMI 29.29 kg/m  CONSTITUTIONAL: Well-developed, well-nourished female in no acute distress.  HENT:  Normocephalic, atraumatic, External right and left ear normal.  EYES: Conjunctivae and EOM are normal. Pupils are equal, round, and reactive to light. No scleral icterus.  NECK: Normal range of motion, supple, no masses.  Normal thyroid.  SKIN: Skin is warm and dry. No rash noted. Not diaphoretic. No erythema. No pallor. MUSCULOSKELETAL: Normal range of motion. No tenderness.  No cyanosis, clubbing, or edema. NEUROLOGIC: Alert and oriented to person, place, and time. Normal reflexes, muscle tone coordination.  PSYCHIATRIC: Normal mood and affect. Normal behavior. Normal judgment and thought content. CARDIOVASCULAR: Normal heart rate noted, regular rhythm RESPIRATORY: Clear to auscultation bilaterally. Effort and breath sounds normal, no problems with respiration noted. BREASTS:  Symmetric in size. No masses, tenderness, skin changes, nipple drainage, or lymphadenopathy bilaterally. Performed in the presence of a chaperone. ABDOMEN: Soft, no distention noted.  No tenderness, rebound or guarding.  PELVIC: Normal appearing external genitalia and urethral meatus; normal appearing vaginal mucosa and cervix.  No abnormal vaginal discharge noted.  Pap smear obtained.  Normal uterine size, no other palpable masses, no uterine or adnexal tenderness.  Performed in the presence of a chaperone.   Assessment and Plan:     1. Breast cancer screening by mammogram Mammogram to be scheduled for patient at age 44, early next year as per [facility] recommendations. Had normal diagnostic last year, FH of breast cancer in her mother at age 24.  - MM 3D SCREEN BREAST BILATERAL; Future  2. Well woman exam with routine gynecological exam - Cytology - PAP Will follow up results of pap smear and manage accordingly. Routine preventative health maintenance measures emphasized. Please refer to After Visit Summary for other counseling recommendations.      Jaynie Collins, MD, FACOG Obstetrician & Gynecologist, [facility] for [employer], [facility]

## 2021-01-30 LAB — CYTOLOGY - PAP
Comment: NEGATIVE
Diagnosis: NEGATIVE
High risk HPV: NEGATIVE

## 2021-03-05 ENCOUNTER — Ambulatory Visit: Payer: 59

## 2021-11-21 ENCOUNTER — Ambulatory Visit: Payer: 59

## 2021-11-21 ENCOUNTER — Other Ambulatory Visit (HOSPITAL_COMMUNITY)
Admission: RE | Admit: 2021-11-21 | Discharge: 2021-11-21 | Disposition: A | Discharge status: Home / Self Care | Payer: 59 | Source: Ambulatory Visit | Attending: Advanced Practice Midwife | Admitting: Advanced Practice Midwife

## 2021-11-21 ENCOUNTER — Ambulatory Visit (INDEPENDENT_AMBULATORY_CARE_PROVIDER_SITE_OTHER): Payer: 59 | Admitting: *Deleted

## 2021-11-21 VITALS — BP 112/75 | HR 65

## 2021-11-21 DIAGNOSIS — N898 Other specified noninflammatory disorders of vagina: Secondary | ICD-10-CM | POA: Insufficient documentation

## 2021-11-21 NOTE — Progress Notes (Cosign Needed)
SUBJECTIVE:  41 y.o. female complains of vaginal erythema noted for a few day(s). Denies abnormal vaginal bleeding or significant pelvic pain or fever. No UTI symptoms. Denies history of known exposure to STD.  No LMP recorded.  OBJECTIVE:  She appears well, afebrile. Urine dipstick: not done.  ASSESSMENT:  Vaginal itching  Vaginal irritation    PLAN:  GC, chlamydia, trichomonas, BVAG, CVAG probe sent to lab. Treatment: To be determined once lab results are received ROV prn if symptoms persist or worsen.

## 2021-11-21 NOTE — Progress Notes (Signed)
ATTESTATION OF SUPERVISION OF RN: Evaluation and management procedures were performed by the RN under my supervision and collaboration. I have reviewed the nursing note and chart and agree with the management and plan for this patient.  Julene Rahn, CNM  

## 2021-11-24 LAB — CERVICOVAGINAL ANCILLARY ONLY
Bacterial Vaginitis (gardnerella): NEGATIVE
Candida Glabrata: NEGATIVE
Candida Vaginitis: POSITIVE — AB
Chlamydia: NEGATIVE
Comment: NEGATIVE
Comment: NEGATIVE
Comment: NEGATIVE
Comment: NEGATIVE
Comment: NEGATIVE
Comment: NORMAL
Neisseria Gonorrhea: NEGATIVE
Trichomonas: NEGATIVE

## 2021-11-26 ENCOUNTER — Other Ambulatory Visit: Payer: Self-pay | Admitting: *Deleted

## 2021-11-26 MED ORDER — FLUCONAZOLE 150 MG PO TABS: 150.0000 mg | ORAL_TABLET | Freq: Once | ORAL | 3 refills | Status: AC

## 2022-01-29 ENCOUNTER — Ambulatory Visit: Payer: 59 | Admitting: Family Medicine

## 2022-02-17 ENCOUNTER — Ambulatory Visit: Payer: 59 | Admitting: Family Medicine

## 2022-05-26 ENCOUNTER — Other Ambulatory Visit: Payer: Self-pay | Admitting: Family Medicine

## 2022-05-26 DIAGNOSIS — Z1231 Encounter for screening mammogram for malignant neoplasm of breast: Secondary | ICD-10-CM

## 2023-01-15 ENCOUNTER — Ambulatory Visit
Admission: RE | Admit: 2023-01-15 | Discharge: 2023-01-15 | Disposition: A | Discharge status: Home / Self Care | Payer: 59 | Source: Ambulatory Visit | Attending: Family Medicine | Admitting: Family Medicine

## 2023-01-15 DIAGNOSIS — Z1231 Encounter for screening mammogram for malignant neoplasm of breast: Secondary | ICD-10-CM

## 2023-08-14 ENCOUNTER — Other Ambulatory Visit: Payer: Self-pay | Admitting: Family Medicine

## 2023-08-14 ENCOUNTER — Ambulatory Visit: Admitting: Family Medicine

## 2023-08-14 ENCOUNTER — Encounter: Payer: Self-pay | Admitting: Family Medicine

## 2023-08-14 ENCOUNTER — Other Ambulatory Visit (HOSPITAL_COMMUNITY)
Admission: RE | Admit: 2023-08-14 | Discharge: 2023-08-14 | Disposition: A | Discharge status: Home / Self Care | Source: Ambulatory Visit | Attending: Family Medicine | Admitting: Family Medicine

## 2023-08-14 VITALS — BP 111/71 | HR 64 | Wt 168.0 lb

## 2023-08-14 DIAGNOSIS — Z01419 Encounter for gynecological examination (general) (routine) without abnormal findings: Secondary | ICD-10-CM

## 2023-08-14 DIAGNOSIS — Z803 Family history of malignant neoplasm of breast: Secondary | ICD-10-CM | POA: Diagnosis not present

## 2023-08-14 DIAGNOSIS — Z30018 Encounter for initial prescription of other contraceptives: Secondary | ICD-10-CM

## 2023-08-14 DIAGNOSIS — Z1331 Encounter for screening for depression: Secondary | ICD-10-CM | POA: Diagnosis not present

## 2023-08-14 MED ORDER — PHEXXI 1.8-1-0.4 % VA GEL: 1.0000 | VAGINAL | 12 refills | Status: AC | PRN

## 2023-08-14 NOTE — Progress Notes (Signed)
 GYNECOLOGY ANNUAL PREVENTATIVE CARE ENCOUNTER NOTE  Subjective:  [name] is a 43 y.o. G68P1102 female here for a routine annual gynecologic exam.  Current complaints: having recurrent yeast, never had before and has a couple episodes.  Treated with OTC meds.     Menses are regular periods every 28 days Having perimenopausal sx? No Current sx are:NA  Sexually active Not using contraceptive method currently Does not want hormones  Denies abnormal vaginal bleeding, discharge, pelvic pain, problems with intercourse or other gynecologic concerns.    Gynecologic History Patient's last menstrual period was 07/19/2023 (exact date). Contraception: none Last Pap: 2022. Results were: normal Last mammogram: 2024. Results were: normal  Health Maintenance Due  Topic Date Due   HPV VACCINES (1 - 3-dose series) Never done   Hepatitis C Screening  Never done   COVID-19 Vaccine (2 - 2024-25 season) 11/02/2022    The following portions of the patient's history were reviewed and updated as appropriate: allergies, current medications, past family history, past medical history, past social history, past surgical history and problem list.  Review of Systems Pertinent items are noted in HPI.   Objective:  BP 111/71   Pulse 64   Wt 168 lb (76.2 kg)   LMP 07/19/2023 (Exact Date)   BMI 27.96 kg/m  CONSTITUTIONAL: Well-developed, well-nourished female in no acute distress.  HENT:  Normocephalic, atraumatic, External right and left ear normal. Oropharynx is clear and moist EYES:  No scleral icterus.  NECK: Normal range of motion, supple, no masses.  Normal thyroid.  SKIN: Skin is warm and dry. No rash noted. Not diaphoretic. No erythema. No pallor. NEUROLOGIC: Alert and oriented to person, place, and time. Normal reflexes, muscle tone coordination. No cranial nerve deficit noted. PSYCHIATRIC: Normal mood and affect. Normal behavior. Normal judgment and thought content. CARDIOVASCULAR:  Normal heart rate noted, regular rhythm. 2+ distal pulses. RESPIRATORY: Effort and breath sounds normal, no problems with respiration noted. BREASTS: Symmetric in size. No masses, skin changes, nipple drainage, or lymphadenopathy. ABDOMEN: Soft,  no distention noted.  No tenderness, rebound or guarding.  PELVIC: Normal appearing external genitalia; normal appearing vaginal mucosa and cervix.  No abnormal discharge noted.  Pap smear obtained.  Normal uterine size, no other palpable masses, no uterine or adnexal tenderness. Chaperone present for exam MUSCULOSKELETAL: Normal range of motion.     Assessment and Plan:  1) Annual gynecologic examination with pap smear:  Will follow up results of pap smear and manage accordingly.   Routine preventative health maintenance measures emphasized. Reviewed perimenopausal symptoms and management.   2) Contraception counseling: Reviewed non hormonal options. Interested in Phexxi, rx sent  1. Well woman exam with routine gynecological exam (Primary) - Cytology - PAP - Cervicovaginal ancillary only( [place]) - MM 3D SCREENING MAMMOGRAM BILATERAL BREAST; Future - Lactic Ac-Citric Ac-Pot Bitart (PHEXXI) 1.8-1-0.4 % GEL; Place 1 applicator vaginally as needed. Apply right before of up to 1 hour before sex.  Dispense: 5 g; Refill: 12  2. Family history of breast cancer Mother with BC at ~27, now with reoccurrence - [employer] [facility] Guidelines (2+17)  3. Encounter for initial prescription of other contraceptives - Lactic Ac-Citric Ac-Pot Bitart (PHEXXI) 1.8-1-0.4 % GEL; Place 1 applicator vaginally as needed. Apply right before of up to 1 hour before sex.  Dispense: 5 g; Refill: 12   Please refer to After Visit Summary for other counseling recommendations.   No follow-ups on file.  Abner Ables, MD, MPH, ABFM Attending  [facility]

## 2023-08-14 NOTE — Progress Notes (Signed)
 Patient presents for Annual.  LMP: 07/19/2023 Monthly lasting 5 days heavy first few days Last pap: 01/29/21 WNL  Contraception: None pt did not have BTL that is noted in chart.  Mammogram: 01/15/23 WNL will like done at the breast Center. Family Hx of Breast Cancer : Mom dx at 12 and dx again recently at 57. Pt has not had any testing. STD Screening: Declines   CC: recurrent yeast infections

## 2023-08-17 LAB — CERVICOVAGINAL ANCILLARY ONLY
Bacterial Vaginitis (gardnerella): NEGATIVE
Candida Glabrata: NEGATIVE
Candida Vaginitis: NEGATIVE
Comment: NEGATIVE
Comment: NEGATIVE
Comment: NEGATIVE

## 2023-08-19 ENCOUNTER — Ambulatory Visit: Payer: Self-pay | Admitting: Family Medicine

## 2023-08-19 LAB — CYTOLOGY - PAP
Comment: NEGATIVE
Diagnosis: NEGATIVE
High risk HPV: NEGATIVE

## 2023-08-24 LAB — EMPOWER GYN GUIDELINES (2+17): REPORT SUMMARY: NEGATIVE

## 2023-10-03 ENCOUNTER — Other Ambulatory Visit: Payer: Self-pay

## 2023-10-03 ENCOUNTER — Emergency Department
Admission: EM | Admit: 2023-10-03 | Discharge: 2023-10-03 | Disposition: A | Discharge status: Home / Self Care | Attending: Emergency Medicine | Admitting: Emergency Medicine

## 2023-10-03 DIAGNOSIS — S0993XA Unspecified injury of face, initial encounter: Secondary | ICD-10-CM | POA: Diagnosis present

## 2023-10-03 DIAGNOSIS — S0181XA Laceration without foreign body of other part of head, initial encounter: Secondary | ICD-10-CM

## 2023-10-03 DIAGNOSIS — W19XXXA Unspecified fall, initial encounter: Secondary | ICD-10-CM | POA: Insufficient documentation

## 2023-10-03 DIAGNOSIS — S01112A Laceration without foreign body of left eyelid and periocular area, initial encounter: Secondary | ICD-10-CM | POA: Insufficient documentation

## 2023-10-03 MED ORDER — LIDOCAINE-EPINEPHRINE (PF) 2 %-1:200000 IJ SOLN
20.0000 mL | Freq: Once | INTRAMUSCULAR | Status: AC
  Administered 2023-10-03: 20 mL via INTRADERMAL
  Filled 2023-10-03: qty 20

## 2023-10-03 NOTE — ED Provider Notes (Signed)
 [facility] Provider Note    Event Date/Time   First MD Initiated Contact with Patient 10/03/23 0201     (approximate)   History   Laceration   HPI  [name] is a 43 y.o. female with no significant past medical history who presents after a fall with laceration above the left eye.  Tetanus is up-to-date     Physical Exam   Triage Vital Signs: ED Triage Vitals  Encounter Vitals Group     BP 10/03/23 0116 (!) 152/89     Girls Systolic BP Percentile --      Girls Diastolic BP Percentile --      Boys Systolic BP Percentile --      Boys Diastolic BP Percentile --      Pulse Rate 10/03/23 0116 84     Resp 10/03/23 0116 18     Temp 10/03/23 0116 97.8 F (36.6 C)     Temp Source 10/03/23 0116 Oral     SpO2 10/03/23 0116 99 %     Weight --      Height --      Head Circumference --      Peak Flow --      Pain Score 10/03/23 0125 3     Pain Loc --      Pain Education --      Exclude from Growth Chart --     Most recent vital signs: Vitals:   10/03/23 0247 10/03/23 0250  BP: (!) 135/91 (!) 135/91  Pulse:  84  Resp:  16  Temp:  97.8 F (36.6 C)  SpO2:  99%     General: Awake, no distress.  CV:  Good peripheral perfusion.  Resp:  Normal effort.  Abd:  No distention.  Other:  3 cm laceration left eyebrow   ED Results / Procedures / Treatments   Labs (all labs ordered are listed, but only abnormal results are displayed) Labs Reviewed - No data to display   EKG     RADIOLOGY     PROCEDURES: yes  Critical Care performed:   .Laceration Repair  Date/Time: 10/03/2023 3:16 AM  Performed by: Arlander Charleston, MD Authorized by: Arlander Charleston, MD   Consent:    Consent obtained:  Verbal   Consent given by:  Patient   Risks discussed:  Infection, pain and poor cosmetic result Universal protocol:    Patient identity confirmed:  Verbally with patient Anesthesia:    Anesthesia method:  Local infiltration   Local  anesthetic:  Lidocaine  2% WITH epi Laceration details:    Location:  Face   Face location:  L eyebrow   Length (cm):  3 Exploration:    Wound extent: no foreign body     Contaminated: no   Treatment:    Area cleansed with:  Povidone-iodine Skin repair:    Repair method:  Sutures   Suture size:  6-0   Suture material:  Prolene   Suture technique:  Simple interrupted   Number of sutures:  2 Approximation:    Approximation:  Close Repair type:    Repair type:  Simple Post-procedure details:    Dressing:  Adhesive bandage   Procedure completion:  Tolerated well, no immediate complications    MEDICATIONS ORDERED IN ED: Medications  lidocaine -EPINEPHrine  (XYLOCAINE  W/EPI) 2 %-1:200000 (PF) injection 20 mL (20 mLs Intradermal Given 10/03/23 0246)     IMPRESSION / MDM / ASSESSMENT AND PLAN / ED COURSE  I reviewed the  triage vital signs and the nursing notes. Patient's presentation is most consistent with acute, uncomplicated illness.  Patient presents with fall with laceration of the left eye brow.  Tetanus up-to-date.  Sutured by me  Suture removal 5 to 7 days      FINAL CLINICAL IMPRESSION(S) / ED DIAGNOSES   Final diagnoses:  Laceration of forehead, initial encounter     Rx / DC Orders   ED Discharge Orders     None        Note:  This document was prepared using Dragon voice recognition software and may include unintentional dictation errors.   Arlander Charleston, MD 10/03/23 [phone]

## 2023-10-03 NOTE — ED Triage Notes (Signed)
 Pt tripped over a curb approx 1 hour ago and has laceration at left brow. Bleeding controlled. Denies LOC

## 2023-10-03 NOTE — ED Notes (Signed)
 Pt states she was walking when she tripped on her flip flop and fell face forward. Pt states she was trying to catch herself with her hands but was unable to do so. Pt does have a laceration above the left eyebrow. Bleeding is controlled at this time.

## 2023-10-08 ENCOUNTER — Ambulatory Visit
Admission: EM | Admit: 2023-10-08 | Discharge: 2023-10-08 | Disposition: A | Discharge status: Home / Self Care | Attending: Emergency Medicine | Admitting: Emergency Medicine

## 2023-10-08 ENCOUNTER — Encounter: Payer: Self-pay | Admitting: Emergency Medicine

## 2023-10-08 DIAGNOSIS — S50812A Abrasion of left forearm, initial encounter: Secondary | ICD-10-CM

## 2023-10-08 DIAGNOSIS — L089 Local infection of the skin and subcutaneous tissue, unspecified: Secondary | ICD-10-CM | POA: Diagnosis not present

## 2023-10-08 MED ORDER — CEPHALEXIN 500 MG PO CAPS: 500.0000 mg | ORAL_CAPSULE | Freq: Four times a day (QID) | ORAL | 0 refills | Status: AC

## 2023-10-08 NOTE — ED Triage Notes (Signed)
 Patient tripped over a curb on 10/03/23. Patient has wound to left upper forearm. Redness, and swelling noted. Patient also reports some yellow drainage  . Wound was obtained same day as fall but was not treated treated. Patient has been using neosporin, peroxide and cleanings well antibiotic soap. Has been using large band aide off and on to cover it.

## 2023-10-08 NOTE — Discharge Instructions (Signed)
 You were evaluated for your wound to your arm, based on your exam it is infected and you will be started on antibiotics  Take cephalexin  every 6 hours for 5 days for treatment of bacteria  Continue to clean over the affected area daily with unscented soap and water , cover with a nonstick Band-Aid if draining or if it is at risk for becoming dirty based on your activity otherwise may leave it open to air  You may take Tylenol  and or Motrin  as needed for pain  You may use ice over the affected area if covered by a bandage for general comfort  If your symptoms continue to persist you may follow-up with your primary doctor or this urgent care for reevaluation

## 2023-10-08 NOTE — ED Provider Notes (Signed)
 [facility]    CSN: [id number] Arrival date & time: 10/08/23  1723      History   Chief Complaint Chief Complaint  Patient presents with   Wound Check    HPI [name] is a 43 y.o. female.   Patient presents for evaluation of a wound present to the left forearm beginning 6 days ago after a fall.  Has begun to experience redness, swelling and pain described as burning.  Has been cleansing daily with peroxide and antibiotic soap and applying Neosporin, has covered with a Band-Aid and noticed yellow purulent drainage.  Denies fever.   Past Medical History:  Diagnosis Date   Hypertension    pregnancy induced    Patient Active Problem List   Diagnosis Date Noted   Encounter for initial prescription of contraceptive pills 05/05/2018   Encounter for gynecological examination with Papanicolaou smear of cervix 05/05/2018   SVD (spontaneous vaginal delivery) 01/30/2017   Preterm labor 01/29/2017   Elevated serum creatinine 08/26/2016   Marijuana use 08/26/2016   Supervision of normal pregnancy 08/25/2016   Pregnancy with history of cesarean section, antepartum 08/25/2016   Hx of preeclampsia, prior pregnancy, currently pregnant 08/25/2016    Past Surgical History:  Procedure Laterality Date   CESAREAN SECTION  2008   TUBAL LIGATION N/A 01/31/2017   pt did not have pp tubal     OB History     Gravida  2   Para  2   Term  1   Preterm  1   AB      Living  2      SAB      IAB      Ectopic      Multiple  0   Live Births  2            Home Medications    Prior to Admission medications   Medication Sig Start Date End Date Taking? Authorizing Provider  cephALEXin  (KEFLEX ) 500 MG capsule Take 1 capsule (500 mg total) by mouth 4 (four) times daily. 10/08/23  Yes Mabelle Mungin, Shelba JONELLE, NP  Lactic Ac-Citric Ac-Pot Bitart (PHEXXI ) 1.8-1-0.4 % GEL Place 1 applicator vaginally as needed. Apply right before of up to 1 hour before sex. 08/14/23    Eldonna Suzen Octave, MD  Multiple Vitamin (MULTI-VITAMIN DAILY PO) Multi Vitamin    [provider]    Family History Family History  Problem Relation Age of Onset   Breast cancer Mother 90   Diabetes Father    Cancer Maternal Grandmother        vulva    Social History Social History   Tobacco Use   Smoking status: Former    Types: Cigarettes   Smokeless tobacco: Never  Vaping Use   Vaping status: Never Used  Substance Use Topics   Alcohol use: Yes    Comment: rare   Drug use: No     Allergies   Patient has no known allergies.   Review of Systems Review of Systems   Physical Exam Triage Vital Signs ED Triage Vitals  Encounter Vitals Group     BP 10/08/23 1826 108/67     Girls Systolic BP Percentile --      Girls Diastolic BP Percentile --      Boys Systolic BP Percentile --      Boys Diastolic BP Percentile --      Pulse Rate 10/08/23 1826 61     Resp 10/08/23  1826 18     Temp 10/08/23 1826 98.3 F (36.8 C)     Temp Source 10/08/23 1826 Oral     SpO2 10/08/23 1826 99 %     Weight --      Height --      Head Circumference --      Peak Flow --      Pain Score 10/08/23 1827 4     Pain Loc --      Pain Education --      Exclude from Growth Chart --    No data found.  Updated Vital Signs BP 108/67 (BP Location: Right Arm)   Pulse 61   Temp 98.3 F (36.8 C) (Oral)   Resp 18   LMP 09/15/2023 (Exact Date)   SpO2 99%   Visual Acuity Right Eye Distance:   Left Eye Distance:   Bilateral Distance:    Right Eye Near:   Left Eye Near:    Bilateral Near:     Physical Exam Constitutional:      Appearance: Normal appearance.  Eyes:     Extraocular Movements: Extraocular movements intact.  Pulmonary:     Effort: Pulmonary effort is normal.  Skin:    Comments: Defer to photo   Neurological:     Mental Status: She is alert and oriented to person, place, and time.      UC Treatments / Results  Labs (all labs ordered are listed,  but only abnormal results are displayed) Labs Reviewed - No data to display  EKG   Radiology No results found.  Procedures Procedures (including critical care time)  Medications Ordered in UC Medications - No data to display  Initial Impression / Assessment and Plan / UC Course  I have reviewed the triage vital signs and the nursing notes.  Pertinent labs & imaging results that were available during my care of the patient were reviewed by me and considered in my medical decision making (see chart for details).  Infected abrasion of left forearm, initial encounter  Presentation of the wound is consistent with infection, discussed findings with patient, prescribed cephalexin , wound cleansed with chlorhexidine and nonadherent dressing applied prior to discharge advise daily cleansing with unscented soap and water at home and covering if at risk for contamination, may leave open to air at times, may use over-the-counter analgesics as needed for pain, may apply ice if wound is covered, advised PCP follow-up for persisting symptoms Final Clinical Impressions(s) / UC Diagnoses   Final diagnoses:  Infected abrasion of left forearm, initial encounter     Discharge Instructions      You were evaluated for your wound to your arm, based on your exam it is infected and you will be started on antibiotics  Take cephalexin  every 6 hours for 5 days for treatment of bacteria  Continue to clean over the affected area daily with unscented soap and water , cover with a nonstick Band-Aid if draining or if it is at risk for becoming dirty based on your activity otherwise may leave it open to air  You may take Tylenol  and or Motrin  as needed for pain  You may use ice over the affected area if covered by a bandage for general comfort  If your symptoms continue to persist you may follow-up with your primary doctor or this urgent care for reevaluation   ED Prescriptions     Medication Sig  Dispense Auth. Provider   cephALEXin  (KEFLEX ) 500 MG capsule Take 1  capsule (500 mg total) by mouth 4 (four) times daily. 20 capsule Ashea Winiarski R, NP      PDMP not reviewed this encounter.   Teresa Shelba SAUNDERS, [place] 10/08/23 [phone]

## 2024-01-18 ENCOUNTER — Ambulatory Visit
Admission: RE | Admit: 2024-01-18 | Discharge: 2024-01-18 | Disposition: A | Discharge status: Home / Self Care | Source: Ambulatory Visit | Attending: Family Medicine | Admitting: Family Medicine

## 2024-01-18 DIAGNOSIS — Z01419 Encounter for gynecological examination (general) (routine) without abnormal findings: Secondary | ICD-10-CM
